# Patient Record
Sex: Male | Born: 1955 | Race: White | Hispanic: No | Marital: Married | Smoking: Former smoker
Health system: Southern US, Community
[De-identification: ages and names within clinical notes are randomized; demographics above are authoritative.]

## PROBLEM LIST (undated history)

## (undated) DIAGNOSIS — C61 Malignant neoplasm of prostate: Secondary | ICD-10-CM

## (undated) DIAGNOSIS — Z8042 Family history of malignant neoplasm of prostate: Secondary | ICD-10-CM

## (undated) DIAGNOSIS — I10 Essential (primary) hypertension: Secondary | ICD-10-CM

## (undated) DIAGNOSIS — S92909A Unspecified fracture of unspecified foot, initial encounter for closed fracture: Secondary | ICD-10-CM

## (undated) DIAGNOSIS — K219 Gastro-esophageal reflux disease without esophagitis: Secondary | ICD-10-CM

## (undated) HISTORY — DX: Malignant neoplasm of prostate: C61

## (undated) HISTORY — DX: Unspecified fracture of unspecified foot, initial encounter for closed fracture: S92.909A

## (undated) HISTORY — DX: Family history of malignant neoplasm of prostate: Z80.42

---

## 2013-01-14 DIAGNOSIS — S92909A Unspecified fracture of unspecified foot, initial encounter for closed fracture: Secondary | ICD-10-CM

## 2013-01-14 HISTORY — DX: Unspecified fracture of unspecified foot, initial encounter for closed fracture: S92.909A

## 2013-01-14 HISTORY — PX: FOOT FRACTURE SURGERY: SHX645

## 2013-07-13 DIAGNOSIS — R351 Nocturia: Secondary | ICD-10-CM | POA: Insufficient documentation

## 2013-07-13 DIAGNOSIS — N529 Male erectile dysfunction, unspecified: Secondary | ICD-10-CM | POA: Insufficient documentation

## 2014-12-01 ENCOUNTER — Other Ambulatory Visit: Payer: Self-pay | Admitting: Chiropractic Medicine

## 2014-12-01 DIAGNOSIS — M545 Low back pain: Secondary | ICD-10-CM

## 2014-12-13 ENCOUNTER — Other Ambulatory Visit: Payer: Self-pay

## 2015-01-16 ENCOUNTER — Emergency Department (HOSPITAL_COMMUNITY)
Admission: EM | Admit: 2015-01-16 | Discharge: 2015-01-16 | Disposition: A | Discharge status: Home / Self Care | Payer: BLUE CROSS/BLUE SHIELD | Attending: Emergency Medicine | Admitting: Emergency Medicine

## 2015-01-16 ENCOUNTER — Emergency Department (HOSPITAL_COMMUNITY): Payer: BLUE CROSS/BLUE SHIELD

## 2015-01-16 ENCOUNTER — Encounter (HOSPITAL_COMMUNITY): Payer: Self-pay | Admitting: Emergency Medicine

## 2015-01-16 DIAGNOSIS — I10 Essential (primary) hypertension: Secondary | ICD-10-CM | POA: Insufficient documentation

## 2015-01-16 DIAGNOSIS — Z79899 Other long term (current) drug therapy: Secondary | ICD-10-CM | POA: Diagnosis not present

## 2015-01-16 DIAGNOSIS — Z87891 Personal history of nicotine dependence: Secondary | ICD-10-CM | POA: Diagnosis not present

## 2015-01-16 DIAGNOSIS — Y9389 Activity, other specified: Secondary | ICD-10-CM | POA: Diagnosis not present

## 2015-01-16 DIAGNOSIS — S060X0A Concussion without loss of consciousness, initial encounter: Secondary | ICD-10-CM | POA: Insufficient documentation

## 2015-01-16 DIAGNOSIS — S0990XA Unspecified injury of head, initial encounter: Secondary | ICD-10-CM | POA: Diagnosis present

## 2015-01-16 DIAGNOSIS — W2113XA Struck by golf club, initial encounter: Secondary | ICD-10-CM | POA: Diagnosis not present

## 2015-01-16 DIAGNOSIS — Y998 Other external cause status: Secondary | ICD-10-CM | POA: Diagnosis not present

## 2015-01-16 DIAGNOSIS — Y9289 Other specified places as the place of occurrence of the external cause: Secondary | ICD-10-CM | POA: Diagnosis not present

## 2015-01-16 DIAGNOSIS — R42 Dizziness and giddiness: Secondary | ICD-10-CM

## 2015-01-16 HISTORY — DX: Essential (primary) hypertension: I10

## 2015-01-16 LAB — COMPREHENSIVE METABOLIC PANEL
ALBUMIN: 3.9 g/dL (ref 3.5–5.0)
ALT: 22 U/L (ref 17–63)
ANION GAP: 7 (ref 5–15)
AST: 27 U/L (ref 15–41)
Alkaline Phosphatase: 45 U/L (ref 38–126)
BUN: 23 mg/dL — ABNORMAL HIGH (ref 6–20)
CHLORIDE: 105 mmol/L (ref 101–111)
CO2: 28 mmol/L (ref 22–32)
Calcium: 9 mg/dL (ref 8.9–10.3)
Creatinine, Ser: 1.05 mg/dL (ref 0.61–1.24)
GFR calc non Af Amer: >60 mL/min (ref >60)
Glucose, Bld: 104 mg/dL — ABNORMAL HIGH (ref 65–99)
POTASSIUM: 4.5 mmol/L (ref 3.5–5.1)
SODIUM: 140 mmol/L (ref 135–145)
Total Bilirubin: 0.8 mg/dL (ref 0.3–1.2)
Total Protein: 6.7 g/dL (ref 6.5–8.1)

## 2015-01-16 LAB — CBC WITH DIFFERENTIAL/PLATELET
Basophils Absolute: 0 10*3/uL (ref 0.0–0.1)
Basophils Relative: 0 %
Eosinophils Absolute: 0.2 10*3/uL (ref 0.0–0.7)
Eosinophils Relative: 4 %
HEMATOCRIT: 42.8 % (ref 39.0–52.0)
HEMOGLOBIN: 14.4 g/dL (ref 13.0–17.0)
LYMPHS ABS: 1.2 10*3/uL (ref 0.7–4.0)
Lymphocytes Relative: 25 %
MCH: 31.6 pg (ref 26.0–34.0)
MCHC: 33.6 g/dL (ref 30.0–36.0)
MCV: 93.9 fL (ref 78.0–100.0)
MONO ABS: 0.4 10*3/uL (ref 0.1–1.0)
MONOS PCT: 8 %
NEUTROS ABS: 3 10*3/uL (ref 1.7–7.7)
NEUTROS PCT: 63 %
Platelets: 207 10*3/uL (ref 150–400)
RBC: 4.56 MIL/uL (ref 4.22–5.81)
RDW: 12.6 % (ref 11.5–15.5)
WBC: 4.8 10*3/uL (ref 4.0–10.5)

## 2015-01-16 LAB — I-STAT TROPONIN, ED: Troponin i, poc: 0.01 ng/mL (ref 0.00–0.08)

## 2015-01-16 MED ORDER — SODIUM CHLORIDE 0.9 % IV BOLUS (SEPSIS)
500.0000 mL | Freq: Once | INTRAVENOUS | Status: AC
  Administered 2015-01-16: 500 mL via INTRAVENOUS

## 2015-01-16 MED ORDER — MECLIZINE HCL 25 MG PO TABS: 25.0000 mg | ORAL_TABLET | Freq: Three times a day (TID) | ORAL | Status: DC | PRN

## 2015-01-16 MED ORDER — MECLIZINE HCL 25 MG PO TABS
25.0000 mg | ORAL_TABLET | Freq: Once | ORAL | Status: AC
  Administered 2015-01-16: 25 mg via ORAL
  Filled 2015-01-16: qty 1

## 2015-01-16 MED ORDER — LORAZEPAM 2 MG/ML IJ SOLN
1.0000 mg | Freq: Once | INTRAMUSCULAR | Status: AC
  Administered 2015-01-16: 1 mg via INTRAVENOUS
  Filled 2015-01-16: qty 1

## 2015-01-16 NOTE — ED Provider Notes (Signed)
CSN: [id number]     Arrival date & time 01/16/15  0804 History   First MD Initiated Contact with Patient 01/16/15 0825     Chief Complaint  Patient presents with  . Head Injury     (Consider location/radiation/quality/duration/timing/severity/associated sxs/prior Treatment) HPI Comments: 60 year old male with history of hypertension presents for dizziness. The patient reports that about 1 week ago he did hit himself in the forehead with a golf club and had a large goose egg on the right anterior forehead. He reports that the swelling and bruising has gone down significantly. He reports over the last few days he has had dizziness and feeling like he was drunk and off balance especially in the mornings. He says that yesterday he was still able to play golf and didn't feel completely off balance but had this underlying dizziness that has persisted. Denies headache. Denies ever having symptoms like this before. Dizziness gets worse with sitting up or laying back or with rolling from one side to another. No fevers or chills.   Past Medical History  Diagnosis Date  . Hypertension    History reviewed. No pertinent past surgical history. No family history on file. Social History  Substance Use Topics  . Smoking status: Former Research scientist (life sciences)  . Smokeless tobacco: None  . Alcohol Use: 1.2 oz/week    2 Cans of beer per week    Review of Systems  Constitutional: Negative for fever, chills, appetite change and fatigue.  HENT: Negative for congestion, postnasal drip, rhinorrhea and sinus pressure.   Eyes: Positive for visual disturbance (blurry/double vision). Negative for pain and redness.  Respiratory: Negative for cough, chest tightness and shortness of breath.   Cardiovascular: Negative for chest pain, palpitations and leg swelling.  Gastrointestinal: Negative for nausea, vomiting, abdominal pain and diarrhea.  Genitourinary: Negative for dysuria, urgency and frequency.  Musculoskeletal: Negative for  myalgias, back pain and gait problem.  Skin: Negative for rash.  Neurological: Positive for dizziness. Negative for syncope, facial asymmetry, weakness, light-headedness, numbness and headaches.  Hematological: Does not bruise/bleed easily.      Allergies  Lisinopril  Home Medications   Prior to Admission medications   Medication Sig Start Date End Date Taking? Authorizing Provider  esomeprazole (NEXIUM) 20 MG packet Take 20 mg by mouth daily before breakfast.   Yes Historical Provider, MD  amLODipine (NORVASC) 2.5 MG tablet Take 2.5 mg by mouth daily.    Historical Provider, MD  meclizine (ANTIVERT) 25 MG tablet Take 1 tablet (25 mg total) by mouth 3 (three) times daily as needed for dizziness. 01/16/15   Harvel Quale, MD   BP 158/103 mmHg  Pulse 56  Temp(Src) 98.2 F (36.8 C) (Oral)  Resp 16  SpO2 100% Physical Exam  Constitutional: He is oriented to person, place, and time. He appears well-developed and well-nourished. No distress.  HENT:  Head: Normocephalic and atraumatic.  Right Ear: External ear normal.  Left Ear: External ear normal.  Mouth/Throat: Oropharynx is clear and moist. No oropharyngeal exudate.  Eyes: Pupils are equal, round, and reactive to light. Right eye exhibits nystagmus (rightward). Left eye exhibits nystagmus (rightward).  Neck: Normal range of motion. Neck supple.  Cardiovascular: Normal rate, regular rhythm, normal heart sounds and intact distal pulses.   No murmur heard. Pulmonary/Chest: Effort normal. No respiratory distress. He has no wheezes. He has no rales.  Abdominal: Soft. He exhibits no distension. There is no tenderness.  Musculoskeletal: He exhibits no edema.  Neurological: He is alert and  oriented to person, place, and time. He has normal strength. No cranial nerve deficit or sensory deficit. He exhibits normal muscle tone. Coordination normal.  Normal finger to nose and heal to shin on examination.    Skin: Skin is warm and dry. No  rash noted. He is not diaphoretic.  Vitals reviewed.   ED Course  Procedures (including critical care time) Labs Review Labs Reviewed  COMPREHENSIVE METABOLIC PANEL - Abnormal; Notable for the following:    Glucose, Bld 104 (*)    BUN 23 (*)    All other components within normal limits  CBC WITH DIFFERENTIAL/PLATELET  I-STAT TROPOININ, ED    Imaging Review Ct Head Wo Contrast  01/16/2015  CLINICAL DATA:  Hip with golf club on 01/10/2015 with persistent right anterior Headache. EXAM: CT HEAD WITHOUT CONTRAST TECHNIQUE: Contiguous axial images were obtained from the base of the skull through the vertex without intravenous contrast. COMPARISON:  None. FINDINGS: There is no evidence for acute hemorrhage, hydrocephalus, mass lesion, or abnormal extra-axial fluid collection. No definite CT evidence for acute infarction. The visualized paranasal sinuses and mastoid air cells are clear. No evidence for skull fracture. IMPRESSION: Normal exam. Electronically Signed   By: Misty Stanley M.D.   On: 01/16/2015 09:16   Mr Brain Wo Contrast  01/16/2015  CLINICAL DATA:  60 year old hypertensive male with injury to right forehead last week. Nausea with dizziness worse with lying down and motion. Subsequent encounter. EXAM: MRI HEAD WITHOUT CONTRAST TECHNIQUE: Multiplanar, multiecho pulse sequences of the brain and surrounding structures were obtained without intravenous contrast. COMPARISON:  01/16/2015 head CT.  No comparison brain MR. FINDINGS: No acute infarct. No intracranial hemorrhage. Right mastoid region 1.9 cm sclerotic focus suggestive of osteoma otherwise no intracranial mass lesion noted on this unenhanced exam. Minimal mucosal thickening inferior right maxillary sinus and ethmoid sinus air cells. Minimal partial opacification left mastoid air cells. Minimal exophthalmos otherwise orbital structures unremarkable. Cervical medullary junction, pituitary region and pineal region within normal limits.  Major intracranial vascular structures are patent. IMPRESSION: No acute abnormality noted. 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 Electronically Signed   By: Genia Del M.D.   On: 01/16/2015 13:25   I have personally reviewed and evaluated these images and lab results as part of my medical decision-making.   EKG Interpretation   Date/Time:  Monday January 16 2015 08:30:11 EST Ventricular Rate:  63 PR Interval:  144 QRS Duration: 87 QT Interval:  426 QTC Calculation: 436 R Axis:   69 Text Interpretation:  Sinus rhythm No previous ECGs available Confirmed by  Dajanee Voorheis ([id number]) on 01/16/2015 8:33:04 AM      MDM  Patient was seen and evaluated in stable condition.  Normal neurologic examination but patient with feeling of being off balance and unsteady.  Labs, EKG, CT head normal.  Patient with persistent dizziness following Antivert and IV fluids.  MRI obtained that was negative for acute process.  Patient remained well appearing but with persistent mild vertigo.  Possible post concussive syndrome.  Discussed with patient all results and clinical impression.  Patient expressed understanding and agreement with plan for discharge with antivert and outpatient follow up.  Patient was discharged home in stable condition. Final diagnoses:  Concussion, without loss of consciousness, initial encounter  Vertigo    1. Post concussive syndrome  2. Vertigo    Harvel Quale, MD 01/17/15 [id number]

## 2015-01-16 NOTE — ED Notes (Signed)
Pt hit in right anterior forehead with gold club on Tuesday. Pt c/o nausea and dizziness worsened by laying down and movement, worse in mornings, slight alteration to proprioception, lower eyelid twitching. Negative Romberg.

## 2015-01-16 NOTE — ED Notes (Signed)
Pt stated dizziness at the time. EKG have been done

## 2015-01-16 NOTE — Discharge Instructions (Signed)
You were seen here today for your dizziness and head injury.  It appears that you have developed what is called vertigo from your head injury earlier.  Take the medication provided to help with symptoms.  Follow up with your primary care physician for reevaluation.  Concussion, Adult A concussion, or closed-head injury, is a brain injury caused by a direct blow to the head or by a quick and sudden movement (jolt) of the head or neck. Concussions are usually not life-threatening. Even so, the effects of a concussion can be serious. If you have had a concussion before, you are more likely to experience concussion-like symptoms after a direct blow to the head.  CAUSES  Direct blow to the head, such as from running into another player during a soccer game, being hit in a fight, or hitting your head on a hard surface.  A jolt of the head or neck that causes the brain to move back and forth inside the skull, such as in a car crash. SIGNS AND SYMPTOMS The signs of a concussion can be hard to notice. Early on, they may be missed by you, family members, and health care providers. You may look fine but act or feel differently. Symptoms are usually temporary, but they may last for days, weeks, or even longer. Some symptoms may appear right away while others may not show up for hours or days. Every head injury is different. Symptoms include:  Mild to moderate headaches that will not go away.  A feeling of pressure inside your head.  Having more trouble than usual:  Learning or remembering things you have heard.  Answering questions.  Paying attention or concentrating.  Organizing daily tasks.  Making decisions and solving problems.  Slowness in thinking, acting or reacting, speaking, or reading.  Getting lost or being easily confused.  Feeling tired all the time or lacking energy (fatigued).  Feeling drowsy.  Sleep disturbances.  Sleeping more than usual.  Sleeping less than  usual.  Trouble falling asleep.  Trouble sleeping (insomnia).  Loss of balance or feeling lightheaded or dizzy.  Nausea or vomiting.  Numbness or tingling.  Increased sensitivity to:  Sounds.  Lights.  Distractions.  Vision problems or eyes that tire easily.  Diminished sense of taste or smell.  Ringing in the ears.  Mood changes such as feeling sad or anxious.  Becoming easily irritated or angry for little or no reason.  Lack of motivation.  Seeing or hearing things other people do not see or hear (hallucinations). DIAGNOSIS Your health care provider can usually diagnose a concussion based on a description of your injury and symptoms. He or she will ask whether you passed out (lost consciousness) and whether you are having trouble remembering events that happened right before and during your injury. Your evaluation might include:  A brain scan to look for signs of injury to the brain. Even if the test shows no injury, you may still have a concussion.  Blood tests to be sure other problems are not present. TREATMENT  Concussions are usually treated in an emergency department, in urgent care, or at a clinic. You may need to stay in the hospital overnight for further treatment.  Tell your health care provider if you are taking any medicines, including prescription medicines, over-the-counter medicines, and natural remedies. Some medicines, such as blood thinners (anticoagulants) and aspirin, may increase the chance of complications. Also tell your health care provider whether you have had alcohol or are taking illegal drugs.  This information may affect treatment.  Your health care provider will send you home with important instructions to follow.  How fast you will recover from a concussion depends on many factors. These factors include how severe your concussion is, what part of your brain was injured, your age, and how healthy you were before the concussion.  Most  people with mild injuries recover fully. Recovery can take time. In general, recovery is slower in older persons. Also, persons who have had a concussion in the past or have other medical problems may find that it takes longer to recover from their current injury. HOME CARE INSTRUCTIONS General Instructions  Carefully follow the directions your health care provider gave you.  Only take over-the-counter or prescription medicines for pain, discomfort, or fever as directed by your health care provider.  Take only those medicines that your health care provider has approved.  Do not drink alcohol until your health care provider says you are well enough to do so. Alcohol and certain other drugs may slow your recovery and can put you at risk of further injury.  If it is harder than usual to remember things, write them down.  If you are easily distracted, try to do one thing at a time. For example, do not try to watch TV while fixing dinner.  Talk with family members or close friends when making important decisions.  Keep all follow-up appointments. Repeated evaluation of your symptoms is recommended for your recovery.  Watch your symptoms and tell others to do the same. Complications sometimes occur after a concussion. Older adults with a brain injury may have a higher risk of serious complications, such as a blood clot on the brain.  Tell your teachers, school nurse, school counselor, coach, athletic trainer, or work Freight forwarder about your injury, symptoms, and restrictions. Tell them about what you can or cannot do. They should watch for:  Increased problems with attention or concentration.  Increased difficulty remembering or learning new information.  Increased time needed to complete tasks or assignments.  Increased irritability or decreased ability to cope with stress.  Increased symptoms.  Rest. Rest helps the brain to heal. Make sure you:  Get plenty of sleep at night. Avoid staying  up late at night.  Keep the same bedtime hours on weekends and weekdays.  Rest during the day. Take daytime naps or rest breaks when you feel tired.  Limit activities that require a lot of thought or concentration. These include:  Doing homework or job-related work.  Watching TV.  Working on the computer.  Avoid any situation where there is potential for another head injury (football, hockey, soccer, basketball, martial arts, downhill snow sports and horseback riding). Your condition will get worse every time you experience a concussion. You should avoid these activities until you are evaluated by the appropriate follow-up health care providers. Returning To Your Regular Activities You will need to return to your normal activities slowly, not all at once. You must give your body and brain enough time for recovery.  Do not return to sports or other athletic activities until your health care provider tells you it is safe to do so.  Ask your health care provider when you can drive, ride a bicycle, or operate heavy machinery. Your ability to react may be slower after a brain injury. Never do these activities if you are dizzy.  Ask your health care provider about when you can return to work or school. Preventing Another Concussion It is very  important to avoid another brain injury, especially before you have recovered. In rare cases, another injury can lead to permanent brain damage, brain swelling, or death. The risk of this is greatest during the first 7-10 days after a head injury. Avoid injuries by:  Wearing a seat belt when riding in a car.  Drinking alcohol only in moderation.  Wearing a helmet when biking, skiing, skateboarding, skating, or doing similar activities.  Avoiding activities that could lead to a second concussion, such as contact or recreational sports, until your health care provider says it is okay.  Taking safety measures in your home.  Remove clutter and tripping  hazards from floors and stairways.  Use grab bars in bathrooms and handrails by stairs.  Place non-slip mats on floors and in bathtubs.  Improve lighting in dim areas. SEEK MEDICAL CARE IF:  You have increased problems paying attention or concentrating.  You have increased difficulty remembering or learning new information.  You need more time to complete tasks or assignments than before.  You have increased irritability or decreased ability to cope with stress.  You have more symptoms than before. Seek medical care if you have any of the following symptoms for more than 2 weeks after your injury:  Lasting (chronic) headaches.  Dizziness or balance problems.  Nausea.  Vision problems.  Increased sensitivity to noise or light.  Depression or mood swings.  Anxiety or irritability.  Memory problems.  Difficulty concentrating or paying attention.  Sleep problems.  Feeling tired all the time. SEEK IMMEDIATE MEDICAL CARE IF:  You have severe or worsening headaches. These may be a sign of a blood clot in the brain.  You have weakness (even if only in one hand, leg, or part of the face).  You have numbness.  You have decreased coordination.  You vomit repeatedly.  You have increased sleepiness.  One pupil is larger than the other.  You have convulsions.  You have slurred speech.  You have increased confusion. This may be a sign of a blood clot in the brain.  You have increased restlessness, agitation, or irritability.  You are unable to recognize people or places.  You have neck pain.  It is difficult to wake you up.  You have unusual behavior changes.  You lose consciousness. MAKE SURE YOU:  Understand these instructions.  Will watch your condition.  Will get help right away if you are not doing well or get worse.   This information is not intended to replace advice given to you by your health care provider. Make sure you discuss any  questions you have with your health care provider.   Document Released: 03/23/2003 Document Revised: 01/21/2014 Document Reviewed: 07/23/2012 Elsevier Interactive Patient Education 2016 [employer].  Vertigo Vertigo means you feel like you or your surroundings are moving when they are not. Vertigo can be dangerous if it occurs when you are at work, driving, or performing difficult activities.  CAUSES  Vertigo occurs when there is a conflict of signals sent to your brain from the visual and sensory systems in your body. There are many different causes of vertigo, including:  Infections, especially in the inner ear.  A bad reaction to a drug or misuse of alcohol and medicines.  Withdrawal from drugs or alcohol.  Rapidly changing positions, such as lying down or rolling over in bed.  A migraine headache.  Decreased blood flow to the brain.  Increased pressure in the brain from a head injury, infection, tumor,  or bleeding. SYMPTOMS  You may feel as though the world is spinning around or you are falling to the ground. Because your balance is upset, vertigo can cause nausea and vomiting. You may have involuntary eye movements (nystagmus). DIAGNOSIS  Vertigo is usually diagnosed by physical exam. If the cause of your vertigo is unknown, your caregiver may perform imaging tests, such as an MRI scan (magnetic resonance imaging). TREATMENT  Most cases of vertigo resolve on their own, without treatment. Depending on the cause, your caregiver may prescribe certain medicines. If your vertigo is related to body position issues, your caregiver may recommend movements or procedures to correct the problem. In rare cases, if your vertigo is caused by certain inner ear problems, you may need surgery. HOME CARE INSTRUCTIONS   Follow your caregiver's instructions.  Avoid driving.  Avoid operating heavy machinery.  Avoid performing any tasks that would be dangerous to you or others during a vertigo  episode.  Tell your caregiver if you notice that certain medicines seem to be causing your vertigo. Some of the medicines used to treat vertigo episodes can actually make them worse in some people. SEEK IMMEDIATE MEDICAL CARE IF:   Your medicines do not relieve your vertigo or are making it worse.  You develop problems with talking, walking, weakness, or using your arms, hands, or legs.  You develop severe headaches.  Your nausea or vomiting continues or gets worse.  You develop visual changes.  A family member notices behavioral changes.  Your condition gets worse. MAKE SURE YOU:  Understand these instructions.  Will watch your condition.  Will get help right away if you are not doing well or get worse.   This information is not intended to replace advice given to you by your health care provider. Make sure you discuss any questions you have with your health care provider.   Document Released: 10/10/2004 Document Revised: 03/25/2011 Document Reviewed: 04/25/2014 Elsevier Interactive Patient Education [employer].

## 2015-02-13 ENCOUNTER — Ambulatory Visit
Admission: RE | Admit: 2015-02-13 | Discharge: 2015-02-13 | Disposition: A | Discharge status: Home / Self Care | Payer: BLUE CROSS/BLUE SHIELD | Source: Ambulatory Visit | Attending: Family Medicine | Admitting: Family Medicine

## 2015-02-13 ENCOUNTER — Telehealth: Payer: Self-pay | Admitting: Diagnostic Neuroimaging

## 2015-02-13 ENCOUNTER — Other Ambulatory Visit: Payer: Self-pay | Admitting: Family Medicine

## 2015-02-13 DIAGNOSIS — R519 Headache, unspecified: Secondary | ICD-10-CM

## 2015-02-13 DIAGNOSIS — R51 Headache: Secondary | ICD-10-CM

## 2015-02-13 DIAGNOSIS — S0990XA Unspecified injury of head, initial encounter: Secondary | ICD-10-CM

## 2015-02-13 NOTE — Telephone Encounter (Signed)
New patient appointment scheduled per Dr Leta Baptist.Marland Kitchen

## 2015-02-13 NOTE — Telephone Encounter (Signed)
Received phone call from Dr. Radene Ou about new consult for outpatient Randy Barrera. Patient had onset of traumatic headache in late December 2016 with normal CT and MRI on 01/16/15. Patient continues to have headache with worsening and new type of headache around 02/03/15.  PLAN: - advised urgent non-contrast CT today - will see patient in consult tomorrow at 3:45pm   Penni Bombard, MD [id number], [id number] PM Certified in Neurology, Neurophysiology and Neuroimaging  [facility] [address] [place], [place] [address] [phone]

## 2015-02-14 ENCOUNTER — Ambulatory Visit (INDEPENDENT_AMBULATORY_CARE_PROVIDER_SITE_OTHER): Payer: BLUE CROSS/BLUE SHIELD | Admitting: Diagnostic Neuroimaging

## 2015-02-14 ENCOUNTER — Encounter: Payer: Self-pay | Admitting: Diagnostic Neuroimaging

## 2015-02-14 VITALS — BP 154/96 | HR 51 | Ht 71.5 in | Wt 193.2 lb

## 2015-02-14 DIAGNOSIS — G44309 Post-traumatic headache, unspecified, not intractable: Secondary | ICD-10-CM | POA: Diagnosis not present

## 2015-02-14 MED ORDER — AMITRIPTYLINE HCL 25 MG PO TABS: 25.0000 mg | ORAL_TABLET | Freq: Every day | ORAL | Status: DC

## 2015-02-14 NOTE — Progress Notes (Signed)
[facility]  PATIENT: Randy Barrera DOB: Nov 02, 1955  REFERRING CLINICIAN: V Rankins HISTORY FROM: patient  REASON FOR VISIT: new consult    HISTORICAL  CHIEF COMPLAINT:  Chief Complaint  Patient presents with  . Headache    rm 7, New pt, HA following 01/10/15 concussion," awoke from sleep on 02/09/15 with HA that felt like explosion in my head and hasn't gone away", Tyl #3 dulls it"     HISTORY OF PRESENT ILLNESS:   60 year old right-handed male here for evaluation of headaches.  01/10/15 patient was playing golf, trying to remove a sand wedge from the bag, when it was suddenly dislodged and came up and struck him in the right for head. He had severe pain and a "knot" developed over the right for head. He did not lose consciousness. No dizziness or nausea. She was able to complete playing golf that day.  Over the next 4 days patient did fine with no symptoms. Around 01/15/15 patient had onset of balance difficulty, vertigo and nausea. The next day he had CT and MRI brain scan performed which were unremarkable. Patient continues to have intermittent mild nausea and dizziness spells. These gradually subsided.  02/09/15 patient woke up in the night with a severe worst headache of life sensation with severe pressure and soreness on the top of his head. Symptoms were worse with lying down and relieved if he sat up. Patient went back to PCP and then had his another CT scan of the head on 02/13/15 which was unremarkable.  Patient continues to have headache sensation. He is having trouble sleeping. Patient has some baseline insomnia and has been on Benadryl at nighttime for several years. He is also taking Tylenol with codeine for headaches.  No prior history of migraine or headaches. No family history of migraine. No other significant traumas or concussions in the past.   REVIEW OF SYSTEMS: Full 14 system review of systems performed and notable only for nausea dizziness  headache aching muscles hearing loss ringing in ears spinning sensation.   ALLERGIES: Allergies  Allergen Reactions  . Lisinopril Hives, Swelling and Rash    HOME MEDICATIONS: Outpatient Prescriptions Prior to Visit  Medication Sig Dispense Refill  . amLODipine (NORVASC) 2.5 MG tablet Take 2.5 mg by mouth daily.    Marland Kitchen esomeprazole (NEXIUM) 20 MG packet Take 20 mg by mouth daily before breakfast.    . meclizine (ANTIVERT) 25 MG tablet Take 1 tablet (25 mg total) by mouth 3 (three) times daily as needed for dizziness. 20 tablet 0   No facility-administered medications prior to visit.    PAST MEDICAL HISTORY: Past Medical History  Diagnosis Date  . Hypertension   . Foot fracture 2015    PAST SURGICAL HISTORY: Past Surgical History  Procedure Laterality Date  . Foot fracture surgery Right 2015    FAMILY HISTORY: Family History  Problem Relation Age of Onset  . Cancer Father     SOCIAL HISTORY:  Social History   Social History  . Marital Status: Married    Spouse Name: Pamala Hurry  . Number of Children: 1  . Years of Education: 16   Occupational History  .      [employer]   Social History Main Topics  . Smoking status: Former Smoker    Quit date: 10/05/1994  . Smokeless tobacco: Not on file  . Alcohol Use: 1.2 oz/week    2 Cans of beer per week     Comment: 2 beers a day  .  Drug Use: No  . Sexual Activity: Not on file   Other Topics Concern  . Not on file   Social History Narrative   Lives at home with wife    Caffeine use- coffee- 2 daily     PHYSICAL EXAM  GENERAL EXAM/CONSTITUTIONAL: Vitals:  Filed Vitals:   02/14/15 1549  BP: 154/96  Pulse: 51  Height: 5' 11.5" (1.816 m)  Weight: 193 lb 3.2 oz (87.635 kg)     Body mass index is 26.57 kg/(m^2).  Visual Acuity Screening   Right eye Left eye Both eyes  Without correction: 20/30 20/30   With correction:        Patient is in no distress; well developed, nourished and groomed; neck is  supple  CARDIOVASCULAR:  Examination of carotid arteries is normal; no carotid bruits  Regular rate and rhythm, no murmurs  Examination of peripheral vascular system by observation and palpation is normal  EYES:  Ophthalmoscopic exam of optic discs and posterior segments is normal; no papilledema or hemorrhages  MUSCULOSKELETAL:  Gait, strength, tone, movements noted in Neurologic exam below  NEUROLOGIC: MENTAL STATUS:  No flowsheet data found.  awake, alert, oriented to person, place and time  recent and remote memory intact  normal attention and concentration  language fluent, comprehension intact, naming intact,   fund of knowledge appropriate  CRANIAL NERVE:   2nd - no papilledema on fundoscopic exam  2nd, 3rd, 4th, 6th - pupils equal and reactive to light, visual fields full to confrontation, extraocular muscles intact, no nystagmus  5th - facial sensation symmetric  7th - facial strength symmetric  8th - hearing intact  9th - palate elevates symmetrically, uvula midline  11th - shoulder shrug symmetric  12th - tongue protrusion midline  MOTOR:   normal bulk and tone, full strength in the BUE, BLE  SENSORY:   normal and symmetric to light touch, temperature, vibration   COORDINATION:   finger-nose-finger, fine finger movements normal  REFLEXES:   deep tendon reflexes present and symmetric  GAIT/STATION:   narrow based gait; able to walk tandem; romberg is negative    DIAGNOSTIC DATA (LABS, IMAGING, TESTING) - I reviewed patient records, labs, notes, testing and imaging myself where available.  Lab Results  Component Value Date   WBC 4.8 01/16/2015   HGB 14.4 01/16/2015   HCT 42.8 01/16/2015   MCV 93.9 01/16/2015   PLT 207 01/16/2015      Component Value Date/Time   NA 140 01/16/2015 0904   K 4.5 01/16/2015 0904   CL 105 01/16/2015 0904   CO2 28 01/16/2015 0904   GLUCOSE 104* 01/16/2015 0904   BUN 23* 01/16/2015 0904    CREATININE 1.05 01/16/2015 0904   CALCIUM 9.0 01/16/2015 0904   PROT 6.7 01/16/2015 0904   ALBUMIN 3.9 01/16/2015 0904   AST 27 01/16/2015 0904   ALT 22 01/16/2015 0904   ALKPHOS 45 01/16/2015 0904   BILITOT 0.8 01/16/2015 0904   GFRNONAA >60 01/16/2015 0904   GFRAA >60 01/16/2015 0904   No results found for: CHOL, HDL, LDLCALC, LDLDIRECT, TRIG, CHOLHDL No results found for: HGBA1C No results found for: VITAMINB12 No results found for: TSH    01/16/15 CT head [I reviewed images myself and agree with interpretation. -VRP]  - normal  01/16/15 MRI brain [I reviewed images myself and agree with interpretation. -VRP]  - No acute abnormality noted. 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  02/13/15 CT head [I reviewed  images myself and agree with interpretation. -VRP]  - No acute, subacute or significant abnormality.      ASSESSMENT AND PLAN  60 y.o. year old male here with right frontal head trauma being struck by golf club on 01/10/15, now with likely postconcussion headache. Serial neuroimaging studies have been unremarkable. Neurologic examination is normal.    Dx:  Post-concussion headache    PLAN: - Advised patient to focus on adequate hydration, nutrition, exercise and sleep.  - Advised that he may need to slightly reduce the frequency and intensity of his activities.  - We'll give patient a trial of amitriptyline at nighttime to see if this helps reduce headache and improve quality of sleep.  Meds ordered this encounter  Medications  . DISCONTD: amitriptyline (ELAVIL) 25 MG tablet    Sig: Take 1 tablet (25 mg total) by mouth at bedtime.    Dispense:  30 tablet    Refill:  3  . amitriptyline (ELAVIL) 25 MG tablet    Sig: Take 1 tablet (25 mg total) by mouth at bedtime.    Dispense:  30 tablet    Refill:  3   Return in about 1 month (around 03/14/2015).    Penni Bombard, MD [id number], [id number] PM Certified in Neurology,  Neurophysiology and Neuroimaging  [facility] [address], [place],  [address] [phone]

## 2015-02-14 NOTE — Patient Instructions (Signed)
Thank you for coming to see [place] at [facility]. I hope we have been able to provide you high quality care today.  You may receive a patient satisfaction survey over the next few weeks. We would appreciate your feedback and comments so that we may continue to improve ourselves and the health of our patients.  - try amitriptyline 52m at bedtime - use tylenol with codeine as needed for headache - moderate your activities and work  ~~~~~~~~~~~~~~~~~~~~~~~~~~~~~~~~~~~~~~~~~~~~~~~~~~~~~~~~~~~~~~~~~  DR. Jasn Xia'S GUIDE TO HAPPY AND HEALTHY LIVING These are some of my general health and wellness recommendations. Some of them may apply to you better than others. Please use common sense as you try these suggestions and feel free to ask me any questions.   ACTIVITY/FITNESS Mental, social, emotional and physical stimulation are very important for brain and body health. Try learning a new activity (arts, music, language, sports, games).  Keep moving your body to the best of your abilities. You can do this at home, inside or outside, the park, community center, gym or anywhere you like. Consider a physical therapist or personal trainer to get started. Consider the app Sworkit. Fitness trackers such as smart-watches, smart-phones or Fitbits can help as well.   NUTRITION Eat more plants: colorful vegetables, nuts, seeds and berries.  Eat less sugar, salt, preservatives and processed foods.  Avoid toxins such as cigarettes and alcohol.  Drink water when you are thirsty. Warm water with a slice of lemon is an excellent morning drink to start the day.  Consider these websites for more information The Nutrition Source (hhttps://www.henry-hernandez.biz/ [employer] Nutrition (wWindowBlog.ch   RELAXATION Consider practicing mindfulness meditation or other relaxation techniques such as deep breathing, prayer, yoga, tai chi, massage. See  website mindful.org or the apps Headspace or Calm to help get started.   SLEEP Try to get at least 7-8+ hours sleep per day. Regular exercise and reduced caffeine will help you sleep better. Practice good sleep hygeine techniques. See website sleep.org for more information.   PLANNING Prepare estate planning, living will, healthcare POA documents. Sometimes this is best planned with the help of an attorney. Theconversationproject.org and agingwithdignity.org are excellent resources.

## 2015-03-23 ENCOUNTER — Ambulatory Visit (INDEPENDENT_AMBULATORY_CARE_PROVIDER_SITE_OTHER): Payer: BLUE CROSS/BLUE SHIELD | Admitting: Diagnostic Neuroimaging

## 2015-03-23 ENCOUNTER — Encounter: Payer: Self-pay | Admitting: Diagnostic Neuroimaging

## 2015-03-23 VITALS — BP 159/95 | HR 65 | Ht 71.5 in | Wt 189.6 lb

## 2015-03-23 DIAGNOSIS — G44309 Post-traumatic headache, unspecified, not intractable: Secondary | ICD-10-CM | POA: Diagnosis not present

## 2015-03-23 DIAGNOSIS — G4700 Insomnia, unspecified: Secondary | ICD-10-CM | POA: Diagnosis not present

## 2015-03-23 MED ORDER — AMITRIPTYLINE HCL 25 MG PO TABS: 25.0000 mg | ORAL_TABLET | Freq: Every day | ORAL | Status: DC

## 2015-03-23 NOTE — Patient Instructions (Signed)
-   may try tapering off amitriptyline (1 tab every other day for 1 week, then stop) to see if side effects improve

## 2015-03-23 NOTE — Progress Notes (Signed)
[facility]  PATIENT: Randy Barrera DOB: 1955-03-18  REFERRING CLINICIAN: V Rankins HISTORY FROM: patient  REASON FOR VISIT: follow up   HISTORICAL  CHIEF COMPLAINT:  Chief Complaint  Patient presents with  . Post concussion HA    rm 6, "dramatically better, occass HA relieved w/Advil, occass vertigo"  . Follow-up    6 week    HISTORY OF PRESENT ILLNESS:   UPDATE 03/23/15: Since last visit, doing much better. HA improved. Some residual dizziness. Has noted loss of libido, loss of interest in drinking beer. Not sure if it is concussion related or medication (amitriptyline related). R eyelid still twitches. Sleep is much improved.  PRIOR HPI (02/14/15): 60 year old right-handed male here for evaluation of headaches. 01/10/15 patient was playing golf, trying to remove a sand wedge from the bag, when it was suddenly dislodged and came up and struck him in the right for head. He had severe pain and a "knot" developed over the right for head. He did not lose consciousness. No dizziness or nausea. She was able to complete playing golf that day. Over the next 4 days patient did fine with no symptoms. Around 01/15/15 patient had onset of balance difficulty, vertigo and nausea. The next day he had CT and MRI brain scan performed which were unremarkable. Patient continues to have intermittent mild nausea and dizziness spells. These gradually subsided. 02/09/15 patient woke up in the night with a severe worst headache of life sensation with severe pressure and soreness on the top of his head. Symptoms were worse with lying down and relieved if he sat up. Patient went back to PCP and then had his another CT scan of the head on 02/13/15 which was unremarkable. Patient continues to have headache sensation. He is having trouble sleeping. Patient has some baseline insomnia and has been on Benadryl at nighttime for several years. He is also taking Tylenol with codeine for headaches. No prior  history of migraine or headaches. No family history of migraine. No other significant traumas or concussions in the past.   REVIEW OF SYSTEMS: Full 14 system review of systems performed and negative.    ALLERGIES: Allergies  Allergen Reactions  . Lisinopril Hives, Swelling and Rash    HOME MEDICATIONS: Outpatient Prescriptions Prior to Visit  Medication Sig Dispense Refill  . Acetaminophen-Codeine (TYLENOL WITH CODEINE #3 PO) Take by mouth. 02/14/15 one tab every 6 hrs    . amLODipine (NORVASC) 2.5 MG tablet Take 2.5 mg by mouth daily.    . diphenhydrAMINE (SOMINEX) 25 MG tablet Take 25 mg by mouth at bedtime as needed for sleep.    Marland Kitchen esomeprazole (NEXIUM) 20 MG packet Take 20 mg by mouth daily before breakfast.    . amitriptyline (ELAVIL) 25 MG tablet Take 1 tablet (25 mg total) by mouth at bedtime. 30 tablet 3   No facility-administered medications prior to visit.    PAST MEDICAL HISTORY: Past Medical History  Diagnosis Date  . Hypertension   . Foot fracture 2015    PAST SURGICAL HISTORY: Past Surgical History  Procedure Laterality Date  . Foot fracture surgery Right 2015    FAMILY HISTORY: Family History  Problem Relation Age of Onset  . Cancer Father     SOCIAL HISTORY:  Social History   Social History  . Marital Status: Married    Spouse Name: Pamala Hurry  . Number of Children: 1  . Years of Education: 16   Occupational History  .  [employer]   Social History Main Topics  . Smoking status: Former Smoker    Quit date: 10/05/1994  . Smokeless tobacco: Not on file  . Alcohol Use: 1.2 oz/week    2 Cans of beer per week     Comment: 2 beers a day  . Drug Use: No  . Sexual Activity: Not on file   Other Topics Concern  . Not on file   Social History Narrative   Lives at home with wife    Caffeine use- coffee- 2 daily     PHYSICAL EXAM  GENERAL EXAM/CONSTITUTIONAL: Vitals:  Filed Vitals:   03/23/15 1312  BP: 159/95  Pulse: 65  Height: 5'  11.5" (1.816 m)  Weight: 189 lb 9.6 oz (86.002 kg)   Body mass index is 26.08 kg/(m^2). No exam data present  Patient is in no distress; well developed, nourished and groomed; neck is supple  CARDIOVASCULAR:  Examination of carotid arteries is normal; no carotid bruits  Regular rate and rhythm, no murmurs  Examination of peripheral vascular system by observation and palpation is normal  EYES:  Ophthalmoscopic exam of optic discs and posterior segments is normal; no papilledema or hemorrhages  MUSCULOSKELETAL:  Gait, strength, tone, movements noted in Neurologic exam below  NEUROLOGIC: MENTAL STATUS:  No flowsheet data found.  awake, alert, oriented to person, place and time  recent and remote memory intact  normal attention and concentration  language fluent, comprehension intact, naming intact,   fund of knowledge appropriate  CRANIAL NERVE:   2nd - no papilledema on fundoscopic exam  2nd, 3rd, 4th, 6th - pupils equal and reactive to light, visual fields full to confrontation, extraocular muscles intact, no nystagmus  5th - facial sensation symmetric  7th - facial strength symmetric  8th - hearing intact  9th - palate elevates symmetrically, uvula midline  11th - shoulder shrug symmetric  12th - tongue protrusion midline  MOTOR:   normal bulk and tone, full strength in the BUE, BLE  SENSORY:   normal and symmetric to light touch, temperature, vibration   COORDINATION:   finger-nose-finger, fine finger movements normal  REFLEXES:   deep tendon reflexes present and symmetric  GAIT/STATION:   narrow based gait; able to walk tandem; romberg is negative    DIAGNOSTIC DATA (LABS, IMAGING, TESTING) - I reviewed patient records, labs, notes, testing and imaging myself where available.  Lab Results  Component Value Date   WBC 4.8 01/16/2015   HGB 14.4 01/16/2015   HCT 42.8 01/16/2015   MCV 93.9 01/16/2015   PLT 207 01/16/2015        Component Value Date/Time   NA 140 01/16/2015 0904   K 4.5 01/16/2015 0904   CL 105 01/16/2015 0904   CO2 28 01/16/2015 0904   GLUCOSE 104* 01/16/2015 0904   BUN 23* 01/16/2015 0904   CREATININE 1.05 01/16/2015 0904   CALCIUM 9.0 01/16/2015 0904   PROT 6.7 01/16/2015 0904   ALBUMIN 3.9 01/16/2015 0904   AST 27 01/16/2015 0904   ALT 22 01/16/2015 0904   ALKPHOS 45 01/16/2015 0904   BILITOT 0.8 01/16/2015 0904   GFRNONAA >60 01/16/2015 0904   GFRAA >60 01/16/2015 0904   No results found for: CHOL, HDL, LDLCALC, LDLDIRECT, TRIG, CHOLHDL No results found for: HGBA1C No results found for: VITAMINB12 No results found for: TSH    01/16/15 CT head [I reviewed images myself and agree with interpretation. -VRP]  - normal  01/16/15 MRI brain [I reviewed images  myself and agree with interpretation. -VRP]  - No acute abnormality noted. 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  02/13/15 CT head [I reviewed images myself and agree with interpretation. -VRP]  - No acute, subacute or significant abnormality.      ASSESSMENT AND PLAN  60 y.o. year old male here with right frontal head trauma being struck by golf club on 01/10/15, now with likely postconcussion headache. Serial neuroimaging studies have been unremarkable. Neurologic examination is normal. Patient improving with time and medications (amitriptyline) but some side effects noted (decr libido, anhedonia).    Dx:  Post-concussion headache  Insomnia    PLAN: I spent 15 minutes of face to face time with patient. Greater than 50% of time was spent in counseling and coordination of care with patient. In summary we discussed:  - Advised patient to focus on adequate hydration, nutrition, exercise and sleep.  - Advised that he may need to slightly reduce the frequency and intensity of his activities.  - may try tapering off amitriptyline (1 tab every other day for 1 week, then stop) to see if side  effects improve  Meds ordered this encounter  Medications  . amitriptyline (ELAVIL) 25 MG tablet    Sig: Take 1 tablet (25 mg total) by mouth at bedtime.    Dispense:  30 tablet    Refill:  3   Return in about 3 months (around 06/23/2015).    Penni Bombard, MD [id number], [id number] PM Certified in Neurology, Neurophysiology and Neuroimaging  [facility] [address], [place], [place] [address] [phone]

## 2015-09-07 ENCOUNTER — Other Ambulatory Visit: Payer: Self-pay | Admitting: Diagnostic Neuroimaging

## 2015-09-08 NOTE — Telephone Encounter (Signed)
Spoke to patient re: refill on amitriptyline. He stated to disregard, it was sent in error. He stated he is no longer taking it, and he feels better. Although he stated he did not feel he was having side effects form the medication.   Reminded him that Dr Leta Baptist wanted to see him for a 3 month follow up; he was last seen 03/2015.   Scheduled his FU for 09/19/15; advised he arrive 15 min early. He verbalized understanding, appreciation for call.

## 2015-09-19 ENCOUNTER — Ambulatory Visit: Payer: Self-pay | Admitting: Diagnostic Neuroimaging

## 2015-11-28 SURGERY — Surgical Case
Anesthesia: *Unknown

## 2017-02-05 DIAGNOSIS — Z005 Encounter for examination of potential donor of organ and tissue: Secondary | ICD-10-CM | POA: Insufficient documentation

## 2017-02-06 DIAGNOSIS — E538 Deficiency of other specified B group vitamins: Secondary | ICD-10-CM | POA: Insufficient documentation

## 2017-03-18 DIAGNOSIS — Z905 Acquired absence of kidney: Secondary | ICD-10-CM | POA: Insufficient documentation

## 2017-03-28 HISTORY — PX: KIDNEY DONATION: SHX685

## 2017-07-24 DIAGNOSIS — Z524 Kidney donor: Secondary | ICD-10-CM | POA: Insufficient documentation

## 2018-01-06 ENCOUNTER — Encounter: Payer: Self-pay | Admitting: Genetic Counselor

## 2018-01-06 ENCOUNTER — Telehealth: Payer: Self-pay | Admitting: Genetic Counselor

## 2018-01-06 NOTE — Telephone Encounter (Signed)
Received a genetic counseling referral from Dr. Jeffie Pollock for a strong family history of prostate cancer. A genetic counseling appt has been scheduled for the pt to see Roma Kayser on 1/22 at 1pm. Letter mailed to the pt.

## 2018-01-15 ENCOUNTER — Encounter: Payer: Self-pay | Admitting: Genetic Counselor

## 2018-01-15 NOTE — Progress Notes (Signed)
Patient called and left voicemail inquiring about genetic testing approval.  Advised patient I could forward his information to Santiago Glad w/ genetics and requesting a copy of his new insurance card. He states he has Karen's number and he emailed me a copy of his card.  Forwarded information to [facility] at [facility] and here.  He has my contact name and number for any additional financial questions or concerns.

## 2018-01-21 ENCOUNTER — Telehealth: Payer: Self-pay | Admitting: Genetic Counselor

## 2018-01-21 NOTE — Telephone Encounter (Signed)
Patient Randy Barrera asking whether his new insurance [facility]) will cover genetic counseling and testing.  I Randy Barrera on his VM asking that he CB.

## 2018-02-04 ENCOUNTER — Inpatient Hospital Stay: Payer: 59 | Attending: Genetic Counselor | Admitting: Genetic Counselor

## 2018-02-04 ENCOUNTER — Encounter: Payer: Self-pay | Admitting: Genetic Counselor

## 2018-02-04 ENCOUNTER — Inpatient Hospital Stay: Payer: 59

## 2018-02-04 DIAGNOSIS — C61 Malignant neoplasm of prostate: Secondary | ICD-10-CM | POA: Diagnosis not present

## 2018-02-04 DIAGNOSIS — Z8042 Family history of malignant neoplasm of prostate: Secondary | ICD-10-CM

## 2018-02-04 NOTE — Progress Notes (Signed)
REFERRING PROVIDER: Irine Seal, MD [address] [place], [place] [address]  PRIMARY PROVIDER:  Lawerance Cruel, MD  PRIMARY REASON FOR VISIT:  1. Family history of prostate cancer   2. Prostate cancer ([place])      HISTORY OF PRESENT ILLNESS:   Randy Barrera, a 63 y.o. male, was seen for a [place] cancer genetics consultation at the request of Dr. Jeffie Pollock due to a personal and family history of cancer.  Randy Barrera presents to clinic today to discuss the possibility of a hereditary predisposition to cancer, genetic testing, and to further clarify his future cancer risks, as well as potential cancer risks for family members.   In November 2019, at the age of 73, Randy Barrera was diagnosed with prostate cancer.  Gleason score was 3+3=6.       CANCER HISTORY:   No history exists.     Past Medical History:  Diagnosis Date  . Family history of prostate cancer   . Foot fracture 2015  . Hypertension   . Prostate cancer [facility])     Past Surgical History:  Procedure Laterality Date  . FOOT FRACTURE SURGERY Right 2015    Social History   Socioeconomic History  . Marital status: Married    Spouse name: Pamala Hurry  . Number of children: 1  . Years of education: 72  . Highest education level: Not on file  Occupational History    Comment: [employer]  . Financial resource strain: Not on file  . Food insecurity:    Worry: Not on file    Inability: Not on file  . Transportation needs:    Medical: Not on file    Non-medical: Not on file  Tobacco Use  . Smoking status: Former Smoker    Last attempt to quit: 10/05/1994    Years since quitting: 23.3  Substance and Sexual Activity  . Alcohol use: Yes    Alcohol/week: 2.0 standard drinks    Types: 2 Cans of beer per week    Comment: 2 beers a day  . Drug use: No  . Sexual activity: Not on file  Lifestyle  . Physical activity:    Days per week: Not on file    Minutes per session: Not on file  . Stress: Not on file   Relationships  . Social connections:    Talks on phone: Not on file    Gets together: Not on file    Attends religious service: Not on file    Active member of club or organization: Not on file    Attends meetings of clubs or organizations: Not on file    Relationship status: Not on file  Other Topics Concern  . Not on file  Social History Narrative   Lives at home with wife    Caffeine use- coffee- 2 daily     FAMILY HISTORY:  We obtained a detailed, 4-generation family history.  Significant diagnoses are listed below: Family History  Problem Relation Age of Onset  . Prostate cancer Father   . Prostate cancer Brother     The patient has one daughter who is cancer free.  He has three brothers, one who was daignosed with prostate cancer at age 67. Both parents are deceased.  The patient's mother had COPD.  She had a brother and sister who are reportedly cancer free.  Her parents are deceased, the grandmother had some form of cancer.  The grandfather had liver cirrhosis.  The patient's father  was diagnosed with prostate cancer around age 54.  He had one sister who was cancer free.  There is no other reported family history of cancer.  Randy Barrera is unaware of previous family history of genetic testing for hereditary cancer risks. Patient's maternal ancestors are of Caucasian descent. There is no reported Ashkenazi Jewish ancestry. There is no known consanguinity.  GENETIC COUNSELING ASSESSMENT: Randy Barrera is a 63 y.o. male with a personal and family history of prostate cancer which is somewhat suggestive of a hereditary cancer syndrome and predisposition to cancer. We, therefore, discussed and recommended the following at today's visit.   DISCUSSION: We discussed that about 15% of prostate cancer is hereditary.  There are several genes that can increase the risk for prostate cancer, most have other cancers associated with it such as breast, ovarian, pancreatic and colon cancer.   One gene, HOXB13 is solely a prostate cancer gene, that does not seem to have other cancers associated with it.  Based on the lack of family history of other cancers, the likelihood for his prostate cancer to be associated with a gene such as BRCA is low, although not zero.    We reviewed the characteristics, features and inheritance patterns of hereditary cancer syndromes. We also discussed genetic testing, including the appropriate family members to test, the process of testing, insurance coverage and turn-around-time for results. We discussed the implications of a negative, positive and/or variant of uncertain significant result. We recommended Randy Barrera pursue genetic testing for the common hereditary cancer gene panel. The Hereditary Gene Panel offered by [facility] includes sequencing and/or deletion duplication testing of the following 47 genes: APC, ATM, AXIN2, BARD1, BMPR1A, BRCA1, BRCA2, BRIP1, CDH1, CDK4, CDKN2A (p14ARF), CDKN2A (p16INK4a), CHEK2, CTNNA1, DICER1, EPCAM (Deletion/duplication testing only), GREM1 (promoter region deletion/duplication testing only), KIT, MEN1, MLH1, MSH2, MSH3, MSH6, MUTYH, NBN, NF1, NHTL1, PALB2, PDGFRA, PMS2, POLD1, POLE, PTEN, RAD50, RAD51C, RAD51D, SDHB, SDHC, SDHD, SMAD4, SMARCA4. STK11, TP53, TSC1, TSC2, and VHL.  The following genes were evaluated for sequence changes only: SDHA and HOXB13 c.251G>A variant only.   Based on Randy Barrera personal and family history of cancer, he meets medical criteria for genetic testing. Despite that he meets criteria, he may still have an out of pocket cost. We discussed that if his out of pocket cost for testing is over $100, the laboratory will call and confirm whether he wants to proceed with testing.  If the out of pocket cost of testing is less than $100 he will be billed by the genetic testing laboratory.   PLAN: After considering the risks, benefits, and limitations, Randy Barrera  provided informed consent to pursue genetic  testing and the blood sample was sent to [facility] for analysis of the common hereditary cancer panel. Results should be available within approximately 2-3 weeks' time, at which point they will be disclosed by telephone to Randy Barrera, as will any additional recommendations warranted by these results. Randy Barrera will receive a summary of his genetic counseling visit and a copy of his results once available. This information will also be available in Epic. We encouraged Randy Barrera to remain in contact with cancer genetics annually so that we can continuously update the family history and inform him of any changes in cancer genetics and testing that may be of benefit for his family. Randy Barrera questions were answered to his satisfaction today. Our contact information was provided should additional questions or concerns arise.  Based on Randy Barrera family history, we  recommended his brother, who was diagnosed with prostate cancer at age 21, have genetic counseling and testing. Randy Barrera will let us know if we can be of any assistance in coordinating genetic counseling and/or testing for this family member.   Lastly, we encouraged Randy Barrera to remain in contact with cancer genetics annually so that we can continuously update the family history and inform him of any changes in cancer genetics and testing that may be of benefit for this family.   Mr.  Barrera questions were answered to his satisfaction today. Our contact information was provided should additional questions or concerns arise. Thank you for the referral and allowing [place] to share in the care of your patient.   Randy Barrera, [place], [facility] Certified Genetic Counselor [email or web address] phone: [phone]  The patient was seen for a total of 40 minutes in face-to-face genetic counseling.  This patient was discussed with Drs. Magrinat, Lindi Adie and/or Burr Medico who agrees with the above.     _______________________________________________________________________ For Office Staff:  Number of people involved in session: 1 Was an Intern/ student involved with case: no

## 2018-02-24 ENCOUNTER — Encounter: Payer: Self-pay | Admitting: Genetic Counselor

## 2018-02-24 ENCOUNTER — Telehealth: Payer: Self-pay | Admitting: Genetic Counselor

## 2018-02-24 DIAGNOSIS — Z1379 Encounter for other screening for genetic and chromosomal anomalies: Secondary | ICD-10-CM | POA: Insufficient documentation

## 2018-02-24 NOTE — Telephone Encounter (Signed)
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  This does not explain the cancer in the family.

## 2018-02-25 ENCOUNTER — Ambulatory Visit: Payer: Self-pay | Admitting: Genetic Counselor

## 2018-02-25 DIAGNOSIS — Z1379 Encounter for other screening for genetic and chromosomal anomalies: Secondary | ICD-10-CM

## 2018-02-25 NOTE — Progress Notes (Signed)
HPI:  [name] was previously seen in the [place] clinic due to a personal and family history of cancer and concerns regarding a hereditary predisposition to cancer. Please refer to our prior cancer genetics clinic note for more information regarding [name] medical, social and family histories, and our assessment and recommendations, at the time. [name] recent genetic test results were disclosed to him, as were recommendations warranted by these results. These results and recommendations are discussed in more detail below.  CANCER HISTORY:    Prostate cancer [facility])    Initial Diagnosis    Prostate cancer ([place])    02/19/2018 Genetic Testing    MUTYH c.1187G>A single pathogenic variant identifed on the common hereditary cancer panel.  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  The Hereditary Gene Panel offered by [facility] includes sequencing and/or deletion duplication testing of the following 47 genes: APC, ATM, AXIN2, BARD1, BMPR1A, BRCA1, BRCA2, BRIP1, CDH1, CDK4, CDKN2A (p14ARF), CDKN2A (p16INK4a), CHEK2, CTNNA1, DICER1, EPCAM (Deletion/duplication testing only), GREM1 (promoter region deletion/duplication testing only), KIT, MEN1, MLH1, MSH2, MSH3, MSH6, MUTYH, NBN, NF1, NHTL1, PALB2, PDGFRA, PMS2, POLD1, POLE, PTEN, RAD50, RAD51C, RAD51D, SDHB, SDHC, SDHD, SMAD4, SMARCA4. STK11, TP53, TSC1, TSC2, and VHL.  The following genes were evaluated for sequence changes only: SDHA and HOXB13 c.251G>A variant only. The report date is February 19, 2018.     FAMILY HISTORY:  We obtained a detailed, 4-generation family history.  Significant diagnoses are listed below: Family History  Problem Relation Age of Onset  . Prostate cancer Father   . Prostate cancer Brother     The patient has one daughter who is cancer free.  He has three brothers, one who was diagnosed with prostate cancer at age 2. Both parents are deceased.  The patient's  mother had COPD.  She had a brother and sister who are reportedly cancer free.  Her parents are deceased, the grandmother had some form of cancer.  The grandfather had liver cirrhosis.  The patient's father was diagnosed with prostate cancer around age 46.  He had one sister who was cancer free.  There is no other reported family history of cancer.  [name] is unaware of previous family history of genetic testing for hereditary cancer risks. Patient's maternal ancestors are of Caucasian descent. There is no reported Ashkenazi Jewish ancestry. There is no known consanguinity.   GENETIC TEST RESULTS: Genetic testing reported out on February 24, 2018 through the common hereditary cancer panel  revealed one pathogenic mutation in a gene called MUTYH (also known as MYH). There were no other deleterious mutations. 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     We discussed with [name] that since the current genetic testing is not perfect, it is possible there may be a gene mutation in another of these genes that current testing cannot detect, but that chance is small.  We also discussed, that it is possible that another gene that has not yet been discovered, or that we have not yet tested, is responsible for the cancer diagnoses in the family, and it is, therefore, important to remain in touch with cancer genetics in the future so that we can continue to offer [name] the most up to date genetic testing.   CANCER SCREENING RECOMMENDATIONS:  Because of the recessive nature of this condition, this result is reassuring and indicates that [name] likely does not have an increased risk for a future cancer due to  a mutation in one of these genes. This normal test also suggests that [name] cancer was most likely not due to an inherited predisposition associated with one of these genes.  Most cancers happen by chance and this negative test suggests that his cancer falls into this category.  We,  therefore, recommended he continue to follow the cancer management and screening guidelines provided by his oncology and primary healthcare provider.   An individual's cancer risk and medical management are not determined by genetic test results alone. Overall cancer risk assessment incorporates additional factors, including personal medical history, family history, and any available genetic information that may result in a personalized plan for cancer prevention and surveillance.  We reviewed recessive inheritance and discussed when an individual has two MYH gene mutations, this is associated with an increased risk for adenomatous (precancerous) colon polyps. Recently, the [employer]  (NCCN: V.1.2020) provided national guidelines to manage the colon cancer risk found with single (heterozygous) MUTYH mutation.  These guidelines include having a MUTYH pathogenic mutation and:  Personal history of colon cancer  Follow instructions provided by your physician based on your personal history.  Do not have a personal history of colon cancer but have a parent/sibling/child with colon cancer: Colonoscopy every 5 years starting at age 13 or 65 years younger than the earliest age of onset, whichever is younger.  Do not have a personal history of colon cancer and do not have a parent/sibling/child with colon cancer: Data is uncertain whether specialized screening is needed.  However, to our current knowledge, individuals with only one MYH gene mutation do not have an increased risk for colon polyps. This result does not explain his cancer diagnosis, nor that in the family.  RECOMMENDATIONS FOR FAMILY MEMBERS:  Individuals in this family might be at some increased risk of developing cancer, over the general population risk, simply due to the family history of cancer, or possibly the MUTYH genetic mutation.  We recommended women in this family have a yearly mammogram beginning at age 48,  or 65 years younger than the earliest onset of cancer, an annual clinical breast exam, and perform monthly breast self-exams. Women in this family should also have a gynecological exam as recommended by their primary provider. All family members should have a colonoscopy by age 28, or the instructions above if they test positive for the MUTYH mutation.  Based on [name] family history, we recommended his daughter and brothers undergo genetic testing for MUTYH mutations.  We also recommend that they undergo testing not just for this single mutation, but also for any others, in order to confirm that they do not have MYH-associated polyposis.  [name] will let us know if we can be of any assistance in coordinating genetic counseling and/or testing for this family member.   FOLLOW-UP: Lastly, we discussed with [name] that cancer genetics is a rapidly advancing field and it is possible that new genetic tests will be appropriate for him and/or his family members in the future. We encouraged him to remain in contact with cancer genetics on an annual basis so we can update his personal and family histories and let him know of advances in cancer genetics that may benefit this family.   Our contact number was provided. [name] questions were answered to his satisfaction, and he knows he is welcome to call us at anytime with additional questions or concerns.   Roma Kayser, MS, [facility] Certified Genetic Counselor [email or web address]

## 2018-08-31 ENCOUNTER — Other Ambulatory Visit: Payer: Self-pay | Admitting: Urology

## 2018-08-31 DIAGNOSIS — C61 Malignant neoplasm of prostate: Secondary | ICD-10-CM

## 2018-11-19 ENCOUNTER — Other Ambulatory Visit: Payer: Self-pay

## 2018-11-19 ENCOUNTER — Ambulatory Visit
Admission: RE | Admit: 2018-11-19 | Discharge: 2018-11-19 | Disposition: A | Discharge status: Home / Self Care | Payer: 59 | Source: Ambulatory Visit | Attending: Urology | Admitting: Urology

## 2018-11-19 DIAGNOSIS — C61 Malignant neoplasm of prostate: Secondary | ICD-10-CM

## 2018-11-19 MED ORDER — GADOBENATE DIMEGLUMINE 529 MG/ML IV SOLN
8.0000 mL | Freq: Once | INTRAVENOUS | Status: AC | PRN
  Administered 2018-11-19: 8 mL via INTRAVENOUS

## 2019-01-15 HISTORY — PX: PROSTATE BIOPSY: SHX241

## 2019-02-23 ENCOUNTER — Encounter: Payer: Self-pay | Admitting: *Deleted

## 2019-03-03 ENCOUNTER — Encounter: Payer: Self-pay | Admitting: Radiation Oncology

## 2019-03-03 DIAGNOSIS — I10 Essential (primary) hypertension: Secondary | ICD-10-CM | POA: Insufficient documentation

## 2019-03-03 NOTE — Progress Notes (Signed)
GU Location of Tumor / Histology: prostatic adenocarcinoma  If Prostate Cancer, Gleason Score is (3 + 4) and PSA is (2.50). Prostate volume: 42 cc  Will Chavez was initially diagnosed with low risk prostate cancer in November 2019. He elected for active surveillance.  Biopsies of prostate (if applicable) revealed:    Past/Anticipated interventions by urology, if any: prostate biopsy, active surveillance, prostate biopsy, referral to Dr. Tammi Klippel for consideration of brachytherapy  Past/Anticipated interventions by medical oncology, if any: no  Weight changes, if any: no  Bowel/Bladder complaints, if any: IPSS 3. SHIM 4. Reports he has not been responsive to ED medication. Denies dysuria, hematuria or leakage. Denies any bowel complaints.    Nausea/Vomiting, if any: no  Pain issues, if any:  no  SAFETY ISSUES:  Prior radiation? no  Pacemaker/ICD? no  Possible current pregnancy? no, male patient  Is the patient on methotrexate? no  Current Complaints / other details:  64 year old male. Married with one daughter. Father and brother with hx of prostate ca. Maternal Grandmother had cancer as well but patient is unaware of type.

## 2019-03-05 ENCOUNTER — Ambulatory Visit
Admission: RE | Admit: 2019-03-05 | Discharge: 2019-03-05 | Disposition: A | Discharge status: Home / Self Care | Payer: BC Managed Care – PPO | Source: Ambulatory Visit | Attending: Radiation Oncology | Admitting: Radiation Oncology

## 2019-03-05 ENCOUNTER — Other Ambulatory Visit: Payer: Self-pay

## 2019-03-05 ENCOUNTER — Encounter: Payer: Self-pay | Admitting: Radiation Oncology

## 2019-03-05 VITALS — Ht 72.0 in | Wt 185.0 lb

## 2019-03-05 DIAGNOSIS — C61 Malignant neoplasm of prostate: Secondary | ICD-10-CM

## 2019-03-05 NOTE — Progress Notes (Signed)
See progress note under physician encounter. 

## 2019-03-05 NOTE — Progress Notes (Signed)
Radiation Oncology         [phone] ________________________________  Initial outpatient Consultation - Conducted via Telephone due to current COVID-19 concerns for limiting patient exposure  Name: Randy Barrera MRN: [id number]  Date: 03/05/2019  DOB: [birth year]  LK:TGYB, Dwyane Luo, MD  Raynelle Bring, MD   REFERRING PHYSICIAN: Raynelle Bring, MD  DIAGNOSIS: 64 y.o. gentleman with Stage T1c adenocarcinoma of the prostate with Gleason score of 3+4, and PSA of 2.5.    ICD-10-CM   1. Prostate cancer [facility])  C61     HISTORY OF PRESENT ILLNESS: Randy Barrera is a 63 y.o. male with a diagnosis of prostate cancer. He was noted to have rising PSA of 2.9, up from 2.46 four months prior, by his primary care physician, Dr. Harrington Challenger.  Given the rising PSA in addition to having a strong family history of prostate cancer with his father passing in his 77s from metastatic prostate cancer, and his brother treated with prostatectomy for prostate cancer in his 28's, he was referred for evaluation in urology by Dr. Jeffie Pollock on 10/27/2017. Digital rectal examination was performed at that time revealing no nodules.  His initial biopsy performed on 12/03/2017 showed Gleason 3+3 prostate cancer in 40% of one core.  Prostate volume measured 41 cc.  PSA performed that day was stable at 2.83 so he opted to proceed in active surveillance at that time.  His PSA decreased to 2.12 in 03/2018 and slightly increased at 2.22 in 08/2018 and further increased at 2.5 at the time of his most recent evaluation in 11/2018. Therefore, as part of his active surveillance, he underwent prostate MRI on 11/19/2018 which demonstrated a PI-RADS 3 lesion in left peripheral zone of prostate. The patient proceeded to MRI fusion biopsy on 01/28/2019.  The prostate volume measured 42 cc.  Out of 16 core biopsies, 7 were positive.  The maximum Gleason score was 3+4, and this was seen in all 3 samples from the ROI MRI lesion, as well as the left base  lateral (small focus), left base, and right apex lateral. Additionally, Gleason 3+3 was seen in a small focus in left mid.  After review of his biopsy results with Dr. Jeffie Pollock, he appeared to be leaning towards prostatectomy so he met with Dr. Alinda Money on 02/23/2019 to discuss surgical options further. Per Dr. Lynne Logan note, at the conclusion of their conversation the patient appeared to be leaning towards brachytherapy and thus he has kindly been referred today for discussion of potential radiation treatment options.  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   PREVIOUS RADIATION THERAPY: No  PAST MEDICAL HISTORY:  Past Medical History:  Diagnosis Date  . Family history of prostate cancer   . Foot fracture 2015  . Hypertension   . Prostate cancer ([place])       PAST SURGICAL HISTORY: Past Surgical History:  Procedure Laterality Date  . FOOT FRACTURE SURGERY Right 2015  . PROSTATE BIOPSY      FAMILY HISTORY:  Family History  Problem Relation Age of Onset  . Prostate cancer Father   . Prostate cancer Brother   . Cancer Paternal Grandmother   . Breast cancer Neg Hx   . Colon cancer Neg Hx   . Pancreatic cancer Neg Hx     SOCIAL HISTORY:  Social History   Socioeconomic History  . Marital status: Married    Spouse name: Pamala Hurry  . Number of children: 1  . Years of  education: 16  . Highest education level: Not on file  Occupational History    Comment: [employer]  Tobacco Use  . Smoking status: Former Smoker    Packs/day: 2.00    Years: 20.00    Pack years: 40.00    Types: Cigarettes    Quit date: 10/05/1994    Years since quitting: 24.4  . Smokeless tobacco: Never Used  Substance and Sexual Activity  . Alcohol use: Yes    Alcohol/week: 2.0 standard drinks    Types: 2 Cans of beer per week    Comment: 2 beers a day  . Drug use: No  . Sexual activity: Not Currently  Other Topics Concern  . Not on file  Social History  Narrative   Lives at home with wife    Caffeine use- coffee- 2 daily   Social Determinants of Health   Financial Resource Strain:   . Difficulty of Paying Living Expenses: Not on file  Food Insecurity:   . Worried About Charity fundraiser in the Last Year: Not on file  . Ran Out of Food in the Last Year: Not on file  Transportation Needs:   . Lack of Transportation (Medical): Not on file  . Lack of Transportation (Non-Medical): Not on file  Physical Activity:   . Days of Exercise per Week: Not on file  . Minutes of Exercise per Session: Not on file  Stress:   . Feeling of Stress : Not on file  Social Connections:   . Frequency of Communication with Friends and Family: Not on file  . Frequency of Social Gatherings with Friends and Family: Not on file  . Attends Religious Services: Not on file  . Active Member of Clubs or Organizations: Not on file  . Attends Archivist Meetings: Not on file  . Marital Status: Not on file  Intimate Partner Violence:   . Fear of Current or Ex-Partner: Not on file  . Emotionally Abused: Not on file  . Physically Abused: Not on file  . Sexually Abused: Not on file    ALLERGIES: Lisinopril  MEDICATIONS:  Current Outpatient Medications  Medication Sig Dispense Refill  . acetaminophen (TYLENOL) 500 MG tablet Hold this medication while you are taking the narcotic pain pain medication    . doxazosin (CARDURA) 2 MG tablet Take by mouth.    . tadalafil (CIALIS) 20 MG tablet Take by mouth.    Marland Kitchen amLODipine (NORVASC) 10 MG tablet     . ESOMEPRAZOLE MAGNESIUM PO Take by mouth.    . fluorouracil (EFUDEX) 5 % cream Apply 1 application topically 2 (two) times daily.    Marland Kitchen ibuprofen (ADVIL) 600 MG tablet Take 600 mg by mouth every 6 (six) hours as needed.    . rosuvastatin (CRESTOR) 10 MG tablet     . traZODone (DESYREL) 50 MG tablet Take by mouth.    . traZODone (DESYREL) 50 MG tablet Take 100 mg by mouth at bedtime as needed.    . zolpidem  (AMBIEN) 5 MG tablet Take by mouth.     No current facility-administered medications for this encounter.    REVIEW OF SYSTEMS:  On review of systems, the patient reports that he is doing well overall. He denies any chest pain, shortness of breath, cough, fevers, chills, night sweats, unintended weight changes. He denies any bowel disturbances, and denies abdominal pain, nausea or vomiting. He denies any new musculoskeletal or joint aches or pains. His IPSS was 3, indicating mild urinary  symptoms. His SHIM was 4, indicating he has severe erectile dysfunction. He reports he has been on medication since his 24's and has tried injections, but this did not respond to oral medications and he had priapism with injections. A complete review of systems is obtained and is otherwise negative.    PHYSICAL EXAM:  Wt Readings from Last 3 Encounters:  03/05/19 185 lb (83.9 kg)  03/23/15 189 lb 9.6 oz (86 kg)  02/14/15 193 lb 3.2 oz (87.6 kg)   Temp Readings from Last 3 Encounters:  01/16/15 98.2 F (36.8 C) (Oral)   BP Readings from Last 3 Encounters:  03/23/15 (!) 159/95  02/14/15 (!) 154/96  01/16/15 (!) 158/103   Pulse Readings from Last 3 Encounters:  03/23/15 65  02/14/15 (!) 51  01/16/15 (!) 56   Pain Assessment Pain Score: 0-No pain/10  Physical exam not performed in light of telephone consult visit format.   KPS = 100  100 - Normal; no complaints; no evidence of disease. 90   - Able to carry on normal activity; minor signs or symptoms of disease. 80   - Normal activity with effort; some signs or symptoms of disease. 55   - Cares for self; unable to carry on normal activity or to do active work. 60   - Requires occasional assistance, but is able to care for most of his personal needs. 50   - Requires considerable assistance and frequent medical care. 30   - Disabled; requires special care and assistance. 72   - Severely disabled; hospital admission is indicated although death not  imminent. 41   - Very sick; hospital admission necessary; active supportive treatment necessary. 10   - Moribund; fatal processes progressing rapidly. 0     - Dead  Karnofsky DA, [place], Craver LS and [facility] [phone]) The use of the nitrogen mustards in the palliative treatment of carcinoma: with particular reference to bronchogenic carcinoma Cancer 1 634-56  LABORATORY DATA:  Lab Results  Component Value Date   WBC 4.8 01/16/2015   HGB 14.4 01/16/2015   HCT 42.8 01/16/2015   MCV 93.9 01/16/2015   PLT 207 01/16/2015   Lab Results  Component Value Date   NA 140 01/16/2015   K 4.5 01/16/2015   CL 105 01/16/2015   CO2 28 01/16/2015   Lab Results  Component Value Date   ALT 22 01/16/2015   AST 27 01/16/2015   ALKPHOS 45 01/16/2015   BILITOT 0.8 01/16/2015     RADIOGRAPHY: No results found.    IMPRESSION/PLAN: This visit was conducted via Telephone to spare the patient unnecessary potential exposure in the healthcare setting during the current COVID-19 pandemic. 1. 64 y.o. gentleman with Stage T1c adenocarcinoma of the prostate with Gleason Score of 3+4, and PSA of 2.5. We discussed the patient's workup and outlined the nature of prostate cancer in this setting. The patient's T stage, Gleason's score, and PSA put him into the favorable intermediate risk group. Accordingly, he is eligible for a variety of potential treatment options including brachytherapy, 5.5 weeks of external radiation or prostatectomy. We discussed the available radiation techniques, and focused on the details and logistics of delivery. We discussed and outlined the risks, benefits, short and long-term effects associated with radiotherapy and compared and contrasted these with prostatectomy. We discussed the role of SpaceOAR in reducing the rectal toxicity associated with radiotherapy. The patient also had questions regarding the use of SBRT to treat prostate cancer and these were answered  to his stated  satisfaction. We also offered a referral to [facility] for further discussion of SBRT, on and/or off clinical trial, if he desires.   At the end of the conversation, the patient remains undecided but appears to be leaning towards moving forward with a 5.5 week course of daily external beam radiotherapy. He plans to discuss this further with his wife over the weekend before making a final decision and has our number to call back with any further questions or concerns.  He appears to have a good understanding of his disease and our treatment recommendations which are of curative intent.  We will share our discussion with Dr. Jeffie Pollock and Dr. Alinda Money and look forward to hearing from the patient in the near future regarding his final treatment decision.   Given current concerns for patient exposure during the COVID-19 pandemic, this encounter was conducted via telephone. The patient was notified in advance and was offered a MyChart meeting to allow for face to face communication but unfortunately reported that he did not have the appropriate resources/technology to support such a visit and instead preferred to proceed with telephone consult. The patient has given verbal consent for this type of encounter. The time spent during this encounter was 75 minutes. The attendants for this meeting include Tyler Pita MD, Ashlyn Bruning PA-C, [place], and patient, Randy Barrera. During the encounter, Tyler Pita MD, Ashlyn Bruning PA-C, and scribe, Wilburn Mylar were located at [place].  Patient, Randy Barrera was located at home.    Nicholos Johns, PA-C    Tyler Pita, MD  [facility] Direct Dial: [phone]  Fax: [phone] [email or web address]  Skype  [email or web address]  This document serves as a record of services personally performed by Tyler Pita, MD and Freeman Caldron, PA-C. It was created on their behalf by  Wilburn Mylar, a trained medical scribe. The creation of this record is based on the scribe's personal observations and the provider's statements to them. This document has been checked and approved by the attending provider.

## 2019-03-09 ENCOUNTER — Telehealth: Payer: Self-pay | Admitting: Medical Oncology

## 2019-03-09 NOTE — Telephone Encounter (Signed)
Spoke with patient to introduce myself as the prostate nurse navigator and discuss my role. I was unable to meet him 2/19, when he consulted with Dr. Tammi Klippel.After some thought, he has decided to move forward with brachytherapy. We discussed the next steps and is aware, Enid Derry will contact him with appointments. I gave him my contact information and asked him to call me with questions or concerns. He was very appreciative of my call and voiced understanding.

## 2019-03-22 ENCOUNTER — Telehealth: Payer: Self-pay | Admitting: *Deleted

## 2019-03-22 NOTE — Progress Notes (Signed)
  Radiation Oncology         [phone] ________________________________  Name: Randy Barrera MRN: [id number]  Date: 03/25/2019  DOB: 1955/07/09  SIMULATION AND TREATMENT PLANNING NOTE PUBIC ARCH STUDY  [id number], Dwyane Luo, MD  Raynelle Bring, MD  DIAGNOSIS: 64 y.o. gentleman with Stage T1c adenocarcinoma of the prostate with Gleason score of 3+4, and PSA of 2.5     ICD-10-CM   1. Prostate cancer ([place])  C61     COMPLEX SIMULATION:  The patient presented today for evaluation for possible prostate seed implant. He was brought to the radiation planning suite and placed supine on the CT couch. A 3-dimensional image study set was obtained in upload to the planning computer. There, on each axial slice, I contoured the prostate gland. Then, using three-dimensional radiation planning tools I reconstructed the prostate in view of the structures from the transperineal needle pathway to assess for possible pubic arch interference. In doing so, I did not appreciate any pubic arch interference. Also, the patient's prostate volume was estimated based on the drawn structure. The volume was 41 cc.  Given the pubic arch appearance and prostate volume, patient remains a good candidate to proceed with prostate seed implant. Today, he freely provided informed written consent to proceed.    PLAN: The patient will undergo prostate seed implant.   ________________________________  Sheral Apley. Tammi Klippel, M.D.

## 2019-03-22 NOTE — Telephone Encounter (Signed)
RETURNED PATIENT'S PHONE CALL, SPOKE WITH PATIENT. ?

## 2019-03-23 ENCOUNTER — Telehealth: Payer: Self-pay | Admitting: *Deleted

## 2019-03-23 NOTE — Telephone Encounter (Signed)
Called patient to inform of implant date, spoke with patient and he is aware of this date. 

## 2019-03-24 ENCOUNTER — Telehealth: Payer: Self-pay | Admitting: *Deleted

## 2019-03-24 NOTE — Telephone Encounter (Signed)
CALLED PATIENT TO REMIND OF PRE-SEED APPTS. FOR 03-25-19, - ARRIVAL TIME- 10 AM, LVM FOR A RETURN CALL

## 2019-03-25 ENCOUNTER — Ambulatory Visit
Admission: RE | Admit: 2019-03-25 | Discharge: 2019-03-25 | Disposition: A | Discharge status: Home / Self Care | Payer: BC Managed Care – PPO | Source: Ambulatory Visit | Attending: Urology | Admitting: Urology

## 2019-03-25 ENCOUNTER — Other Ambulatory Visit (HOSPITAL_COMMUNITY): Payer: BC Managed Care – PPO

## 2019-03-25 ENCOUNTER — Other Ambulatory Visit: Payer: Self-pay

## 2019-03-25 ENCOUNTER — Ambulatory Visit (HOSPITAL_COMMUNITY)
Admission: RE | Admit: 2019-03-25 | Discharge: 2019-03-25 | Disposition: A | Discharge status: Home / Self Care | Payer: BC Managed Care – PPO | Source: Ambulatory Visit | Attending: Urology | Admitting: Urology

## 2019-03-25 ENCOUNTER — Encounter: Payer: Self-pay | Admitting: Medical Oncology

## 2019-03-25 ENCOUNTER — Encounter (HOSPITAL_COMMUNITY)
Admission: RE | Admit: 2019-03-25 | Discharge: 2019-03-25 | Disposition: A | Discharge status: Home / Self Care | Payer: BC Managed Care – PPO | Source: Ambulatory Visit | Attending: Urology | Admitting: Urology

## 2019-03-25 ENCOUNTER — Other Ambulatory Visit: Payer: Self-pay | Admitting: Urology

## 2019-03-25 ENCOUNTER — Ambulatory Visit
Admission: RE | Admit: 2019-03-25 | Discharge: 2019-03-25 | Disposition: A | Discharge status: Home / Self Care | Payer: BC Managed Care – PPO | Source: Ambulatory Visit | Attending: Radiation Oncology | Admitting: Radiation Oncology

## 2019-03-25 DIAGNOSIS — Z01811 Encounter for preprocedural respiratory examination: Secondary | ICD-10-CM | POA: Insufficient documentation

## 2019-03-25 DIAGNOSIS — C61 Malignant neoplasm of prostate: Secondary | ICD-10-CM

## 2019-03-30 ENCOUNTER — Ambulatory Visit (HOSPITAL_COMMUNITY)
Admission: RE | Admit: 2019-03-30 | Discharge: 2019-03-30 | Disposition: A | Discharge status: Home / Self Care | Payer: BC Managed Care – PPO | Source: Ambulatory Visit | Attending: Urology | Admitting: Urology

## 2019-03-30 ENCOUNTER — Other Ambulatory Visit: Payer: Self-pay

## 2019-03-30 ENCOUNTER — Other Ambulatory Visit (HOSPITAL_COMMUNITY): Payer: Self-pay | Admitting: Urology

## 2019-03-30 DIAGNOSIS — C61 Malignant neoplasm of prostate: Secondary | ICD-10-CM | POA: Diagnosis present

## 2019-04-22 ENCOUNTER — Encounter (HOSPITAL_BASED_OUTPATIENT_CLINIC_OR_DEPARTMENT_OTHER): Payer: Self-pay | Admitting: Urology

## 2019-04-22 ENCOUNTER — Other Ambulatory Visit: Payer: Self-pay

## 2019-04-22 NOTE — Progress Notes (Signed)
Spoke w/ via phone for pre-op interview---patient Lab needs dos----  None has lab appt for cbc, cmet, pt, ptt 04-26-2019 at 1130 am    And repeat chest xary with nipple markers patient aware to be done         Lab results------ekg 03-25-2019 epic COVID test ------04-26-2019 100 pm Arrive at -------1045 am 04-29-2019 NPO after ------misnight Medications to take morning of surgery -----amlodipine, nexium Diabetic medication -----n/a Patient Special Instructions -----fleets enema day of surgery Pre-Op special Istructions ----- Patient verbalized understanding of instructions that were given at this phone interview. Patient denies shortness of breath, chest pain, fever, cough a this phone interview.

## 2019-04-22 NOTE — Progress Notes (Signed)
SPOKE WITH PAM GIBOSN, DR WRENN WANTS CHEST XRAY REPEATED WITH NIPPLE MARKERS, ORDER PLACED IN Epic AND DONE WITH NIPPLE MARKERS PLACED IN COMMENTS.

## 2019-04-22 NOTE — Progress Notes (Signed)
Left message with pam gibson at [facility] urology 03-25-2019 and 03-30-2019 chest xrays recommend repeat chest xray with nipple markers to better evaluate finding.

## 2019-04-23 ENCOUNTER — Telehealth: Payer: Self-pay | Admitting: *Deleted

## 2019-04-23 NOTE — Telephone Encounter (Signed)
CALLED PATIENT TO REMIND OF LAB AND COVID TESTING FOR 04-26-19, SPOKE WITH PATIENT AND HE IS AWARE OF THESE APPTS.

## 2019-04-26 ENCOUNTER — Other Ambulatory Visit (HOSPITAL_COMMUNITY)
Admission: RE | Admit: 2019-04-26 | Discharge: 2019-04-26 | Disposition: A | Discharge status: Home / Self Care | Payer: BC Managed Care – PPO | Source: Ambulatory Visit | Attending: Pulmonary Disease | Admitting: Pulmonary Disease

## 2019-04-26 ENCOUNTER — Other Ambulatory Visit: Payer: Self-pay

## 2019-04-26 ENCOUNTER — Encounter (HOSPITAL_COMMUNITY)
Admission: RE | Admit: 2019-04-26 | Discharge: 2019-04-26 | Disposition: A | Discharge status: Home / Self Care | Payer: BC Managed Care – PPO | Source: Ambulatory Visit | Attending: Pulmonary Disease | Admitting: Pulmonary Disease

## 2019-04-26 ENCOUNTER — Other Ambulatory Visit: Payer: Self-pay | Admitting: Urology

## 2019-04-26 ENCOUNTER — Ambulatory Visit (HOSPITAL_COMMUNITY)
Admission: RE | Admit: 2019-04-26 | Discharge: 2019-04-26 | Disposition: A | Discharge status: Home / Self Care | Payer: BC Managed Care – PPO | Source: Ambulatory Visit | Attending: Urology | Admitting: Urology

## 2019-04-26 DIAGNOSIS — Z20822 Contact with and (suspected) exposure to covid-19: Secondary | ICD-10-CM | POA: Insufficient documentation

## 2019-04-26 DIAGNOSIS — R9389 Abnormal findings on diagnostic imaging of other specified body structures: Secondary | ICD-10-CM | POA: Insufficient documentation

## 2019-04-26 LAB — CBC
HCT: 43.2 % (ref 39.0–52.0)
Hemoglobin: 14.4 g/dL (ref 13.0–17.0)
MCH: 31.8 pg (ref 26.0–34.0)
MCHC: 33.3 g/dL (ref 30.0–36.0)
MCV: 95.4 fL (ref 80.0–100.0)
Platelets: 176 10*3/uL (ref 150–400)
RBC: 4.53 MIL/uL (ref 4.22–5.81)
RDW: 12 % (ref 11.5–15.5)
WBC: 4.7 10*3/uL (ref 4.0–10.5)
nRBC: 0 % (ref 0.0–0.2)

## 2019-04-26 LAB — PROTIME-INR
INR: 1 (ref 0.8–1.2)
Prothrombin Time: 13 seconds (ref 11.4–15.2)

## 2019-04-26 LAB — COMPREHENSIVE METABOLIC PANEL
ALT: 20 U/L (ref 0–44)
AST: 21 U/L (ref 15–41)
Albumin: 4.4 g/dL (ref 3.5–5.0)
Alkaline Phosphatase: 45 U/L (ref 38–126)
Anion gap: 6 (ref 5–15)
BUN: 22 mg/dL (ref 8–23)
CO2: 26 mmol/L (ref 22–32)
Calcium: 8.9 mg/dL (ref 8.9–10.3)
Chloride: 103 mmol/L (ref 98–111)
Creatinine, Ser: 1.63 mg/dL — ABNORMAL HIGH (ref 0.61–1.24)
GFR calc Af Amer: 51 mL/min — ABNORMAL LOW (ref >60)
GFR calc non Af Amer: 44 mL/min — ABNORMAL LOW (ref >60)
Glucose, Bld: 95 mg/dL (ref 70–99)
Potassium: 4.6 mmol/L (ref 3.5–5.1)
Sodium: 135 mmol/L (ref 135–145)
Total Bilirubin: 0.9 mg/dL (ref 0.3–1.2)
Total Protein: 6.8 g/dL (ref 6.5–8.1)

## 2019-04-26 LAB — APTT: aPTT: 31 seconds (ref 24–36)

## 2019-04-26 LAB — SARS CORONAVIRUS 2 (TAT 6-24 HRS): SARS Coronavirus 2: NEGATIVE

## 2019-04-27 NOTE — Progress Notes (Addendum)
Left message with Darrel Reach chest xray done 04-26-2019 with nipple markers normal, creatinnie was 1.63 on cmet done 04-26-2019.

## 2019-04-28 ENCOUNTER — Telehealth: Payer: Self-pay | Admitting: *Deleted

## 2019-04-28 NOTE — H&P (Signed)
Pt presents today for pre-operative history and physical exam in anticipation of radioactive seed and space oar placement by Dr. Jeffie Pollock on 04/29/19. He is doing well and is without complaint.   Pt denies F/C, HA, CP, SOB, N/V, diarrhea/constipation, back pain, flank pain, hematuria, and dysuria.   HX:    CC: Prostate Cancer   Physician requesting consult: Dr. Irine Seal  PCP: Dr. Melinda Crutch   [name] is a 64 year old gentleman who was initially diagnosed with very low risk prostate cancer in November 2019 with a PSA of 2.12 and 1 out of 12 biopsy cores with Gleason 3+3=6 adenocarcinoma on his TRUS biopsy from 11//20/19. He elected active surveillance management. An MRI of the prostate on 11/19/18 showed a 1.3 cm PI-RADS 3 lesion at the left base. He underwent an MR/US fusion biopsy on 01/27/18 that demonstrated Gleason 3+4=7 adenocarcinoma with 7 out of 16 biopsy cores positive including 3 out of 3 targeted ROI biopsies.   Family history: Brother and father. His father died in his mid 72s for metastatic disease. His brother was treated in his early 63s with a radical prostatectomy and is doing well.   Imaging studies:  MRI (01/28/19): No SVI, EPE, or LAD. No bone lesions.   PMH: He has a history of GERD, hypertension, hyperlipidemia, and history of gastric ulcer.  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   TNM stage: cT1c N0 Mx  PSA: 2.5  Gleason score: 3+4=7 (Grade group 2)  Biopsy (01/28/19): 7/16 cores positive  Left: L mid (5%, 3+3=6), L lateral base (<5%, 3+4=7), L base (40%, 3+4=7)  Right: R lateral apex (10%, 3+4=7)  MR target: 3/3 cores, 3+4=7 (60%, 60%, 20%)  Prostate volume: 42.0 cc   Nomogram  OC disease: 72%  EPE: 26%  SVI: 2%  LNI: 2%  PFS (5 year, 10 year): 93%, 87%   Urinary function: IPSS is 4.  Erectile function: SHIM score is 6. He has had very inconsistent ability to get an erection over many years. The PDE 5 inhibitors have not typically been helpful  for him. This is currently a lower priority.     ALLERGIES: Lisinopril - Hives    MEDICATIONS: Omeprazole 20 mg capsule,delayed release  Amlodipine Besylate 10 mg tablet  Doxazosin Mesylate 1 mg tablet  Rosuvastatin Calcium  Sildenafil Citrate 100 mg tablet 1 tablet PO prn Ta  Trazodone Hcl 100 mg tablet  Tylenol     GU PSH: Lap Donor Nephrectomy - 2019 Prostate Needle Biopsy - 01/28/2019, 12/03/2017     NON-GU PSH: Foot surgery (unspecified), Right Shoulder Arthroscopy/surgery, Right Surgical Pathology, Gross And Microscopic Examination For Prostate Needle - 01/28/2019, 12/03/2017     GU PMH: Prostate Cancer - 01/28/2019, He has a PIRADS 3 lesion on the MRI at the left base where his prior cancer was found. I will get him set up for an MR Fusion biopsy. I have reviewed the risks of bleeding, infection and voiding difficulty. Levaquin and Valium sent. , - 11/25/2018 (Stable), His PSA is fairly stable and his exam is benign. I will have him return in 3 months with a PSA and MRIP. , - 08/24/2018, His PSA is down and he is doing well. He will return in 3 months with a PSA for an exam. , - 05/22/2018, T1c Nx Mx Gleason 6 very low risk prostate cancer. I discussed AS, RALP, EXRT, seeds, Cryo and HIFU. He is most interested in AS. I discussed genomic testing and genetic counseling  with his strong family history and I will have him see the Dietitian at the [facility] F/u in 3 months with a PSA. , - 12/25/2017 ED due to arterial insufficiency, He has persistent ED with minimal response to meds. - 11/25/2018, (Stable), He is responding to sildenafil and tadalafil., - 08/24/2018 (Stable), He didn't respond to Roxana but has responded to combination sildenafil and tadalafil in the past so he will be given scripts for those meds. 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 I discussed Oral meds, another attempt at injections, VED, LIEST and IPP. He was given [place]  200mg  samples and a script. , - 12/25/2017 Right testicular pain, He has had intermittent right testicular pain that is associated with back pain and is most consistent with a referred radicular origin. - 05/22/2018 Elevated PSA - 12/03/2017, He has an elevated PSA with a strong family history of cancer. I will get him set up for a biopsy and reviewed the risks of bleeding, infection and voiding difficulty. , - 10/27/2017      PMH Notes:  1898-01-14 00:00:00 - Note: Normal Routine History And Physical Adult   NON-GU PMH: Acute gastric ulcer with hemorrhage GERD Hypercholesterolemia Hypertension Skin Cancer, History    FAMILY HISTORY: 1 Daughter - Other Emphysema - Mother Prostate Cancer - Father, Brother Tuberculosis - Father   SOCIAL HISTORY: Marital Status: Married Preferred Language: English; Race: White Current Smoking Status: Patient does not smoke anymore. Has not smoked since 10/15/1994.   Tobacco Use Assessment Completed: Used Tobacco in last 30 days? Does not use smokeless tobacco. Does drink.  Does not use drugs. Drinks 2 caffeinated drinks per day. Has not had a blood transfusion. 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     Notes: [place] 2-3 drinks per night (approx 6 ounces) most nights    REVIEW OF SYSTEMS:    GU Review Male:   Patient denies frequent urination, hard to postpone urination, burning/ pain with urination, get up at night to urinate, leakage of urine, stream starts and stops, trouble starting your stream, have to strain to urinate , erection problems, and penile pain.  Gastrointestinal (Upper):   Patient denies vomiting, indigestion/ heartburn, and nausea.  Gastrointestinal (Lower):   Patient denies diarrhea and constipation.  Constitutional:   Patient denies fever, night sweats, weight loss, and fatigue.  Skin:   Patient denies skin rash/ lesion and itching.  Eyes:   Patient denies blurred vision and double vision.  Ears/ Nose/ Throat:   Patient denies sore  throat and sinus problems.  Hematologic/Lymphatic:   Patient denies swollen glands and easy bruising.  Cardiovascular:   Patient denies leg swelling and chest pains.  Respiratory:   Patient denies cough and shortness of breath.  Endocrine:   Patient denies excessive thirst.  Musculoskeletal:   Patient denies back pain and joint pain.  Neurological:   Patient denies headaches and dizziness.  Psychologic:   Patient denies depression and anxiety.   VITAL SIGNS:      04/20/2019 02:46 PM  Weight 186 lb / 84.37 kg  BP 132/79 mmHg  Heart Rate 57 /min  Temperature 97.8 F / 36.5 C   MULTI-SYSTEM PHYSICAL EXAMINATION:    Constitutional: Well-nourished. No physical deformities. Normally developed. Good grooming.  Neck: Neck symmetrical, not swollen. Normal tracheal position.  Respiratory: Normal breath sounds. No labored breathing, no use of accessory muscles.   Cardiovascular: Regular rate and rhythm. No murmur, no gallop.   Lymphatic: No enlargement  of neck, axillae, groin.  Skin: No paleness, no jaundice, no cyanosis. No lesion, no ulcer, no rash.  Neurologic / Psychiatric: Oriented to time, oriented to place, oriented to person. No depression, no anxiety, no agitation.  Gastrointestinal: No mass, no tenderness, no rigidity, non obese abdomen.  Eyes: Normal conjunctivae. Normal eyelids.  Ears, Nose, Mouth, and Throat: Left ear no scars, no lesions, no masses. Right ear no scars, no lesions, no masses. Nose no scars, no lesions, no masses. Normal hearing. Normal lips.  Musculoskeletal: Normal gait and station of head and neck.     PAST DATA REVIEWED:  Source Of History:  Patient  Records Review:   Previous Patient Records  Urine Test Review:   Urinalysis   04/20/19  Urinalysis  Urine Appearance Clear   Urine Color Yellow   Urine Glucose Neg mg/dL  Urine Bilirubin Neg mg/dL  Urine Ketones Neg mg/dL  Urine Specific Gravity >1.030   Urine Blood Neg ery/uL  Urine pH 5.5   Urine Protein  Trace mg/dL  Urine Urobilinogen 0.2 mg/dL  Urine Nitrites Neg   Urine Leukocyte Esterase Neg leu/uL   PROCEDURES:          Urinalysis - 81003 Dipstick Dipstick Cont'd  Color: Yellow Bilirubin: Neg mg/dL  Appearance: Clear Ketones: Neg mg/dL  Specific Gravity: >1.030 Blood: Neg ery/uL  pH: 5.5 Protein: Trace mg/dL  Glucose: Neg mg/dL Urobilinogen: 0.2 mg/dL    Nitrites: Neg    Leukocyte Esterase: Neg leu/uL    ASSESSMENT:      ICD-10 Details  1 GU:   Elevated PSA - R97.20   2   Prostate Cancer - C61    PLAN:           Schedule Return Visit/Planned Activity: Keep Scheduled Appointment - Schedule Surgery          Document Letter(s):  Created for Patient: Clinical Summary         Notes:   There are no changes in the patients history or physical exam since last evaluation by Dr. Jeffie Pollock. Pt is scheduled to undergo radioactive seed and space oar implant on 04/29/19.   All pt's questions were answered to the best of my ability.

## 2019-04-28 NOTE — Telephone Encounter (Signed)
CALLED PATIENT TO REMIND OF PROCEDURE FOR 04-29-19, LVM FOR A RETURN CALL

## 2019-04-29 ENCOUNTER — Ambulatory Visit (HOSPITAL_BASED_OUTPATIENT_CLINIC_OR_DEPARTMENT_OTHER)
Admission: RE | Admit: 2019-04-29 | Discharge: 2019-04-29 | Disposition: A | Discharge status: Home / Self Care | Payer: BC Managed Care – PPO | Attending: Urology | Admitting: Urology

## 2019-04-29 ENCOUNTER — Ambulatory Visit (HOSPITAL_BASED_OUTPATIENT_CLINIC_OR_DEPARTMENT_OTHER): Payer: BC Managed Care – PPO | Admitting: Anesthesiology

## 2019-04-29 ENCOUNTER — Encounter (HOSPITAL_BASED_OUTPATIENT_CLINIC_OR_DEPARTMENT_OTHER): Payer: Self-pay | Admitting: Urology

## 2019-04-29 ENCOUNTER — Encounter (HOSPITAL_BASED_OUTPATIENT_CLINIC_OR_DEPARTMENT_OTHER)
Admission: RE | Disposition: A | Discharge status: Home / Self Care | Payer: Self-pay | Source: Home / Self Care | Attending: Urology

## 2019-04-29 ENCOUNTER — Ambulatory Visit (HOSPITAL_COMMUNITY): Payer: BC Managed Care – PPO

## 2019-04-29 DIAGNOSIS — K219 Gastro-esophageal reflux disease without esophagitis: Secondary | ICD-10-CM | POA: Diagnosis not present

## 2019-04-29 DIAGNOSIS — Z8711 Personal history of peptic ulcer disease: Secondary | ICD-10-CM | POA: Diagnosis not present

## 2019-04-29 DIAGNOSIS — Z888 Allergy status to other drugs, medicaments and biological substances status: Secondary | ICD-10-CM | POA: Insufficient documentation

## 2019-04-29 DIAGNOSIS — Z87891 Personal history of nicotine dependence: Secondary | ICD-10-CM | POA: Insufficient documentation

## 2019-04-29 DIAGNOSIS — C61 Malignant neoplasm of prostate: Secondary | ICD-10-CM | POA: Insufficient documentation

## 2019-04-29 DIAGNOSIS — Z79899 Other long term (current) drug therapy: Secondary | ICD-10-CM | POA: Diagnosis not present

## 2019-04-29 DIAGNOSIS — Z8042 Family history of malignant neoplasm of prostate: Secondary | ICD-10-CM | POA: Insufficient documentation

## 2019-04-29 DIAGNOSIS — Z905 Acquired absence of kidney: Secondary | ICD-10-CM | POA: Diagnosis not present

## 2019-04-29 DIAGNOSIS — I10 Essential (primary) hypertension: Secondary | ICD-10-CM | POA: Diagnosis not present

## 2019-04-29 DIAGNOSIS — E785 Hyperlipidemia, unspecified: Secondary | ICD-10-CM | POA: Diagnosis not present

## 2019-04-29 HISTORY — PX: RADIOACTIVE SEED IMPLANT: SHX5150

## 2019-04-29 HISTORY — PX: SPACE OAR INSTILLATION: SHX6769

## 2019-04-29 SURGERY — INSERTION, RADIATION SOURCE, PROSTATE
Anesthesia: General | Site: Prostate

## 2019-04-29 MED ORDER — DEXAMETHASONE SODIUM PHOSPHATE 10 MG/ML IJ SOLN
INTRAMUSCULAR | Status: DC | PRN
  Administered 2019-04-29: 5 mg via INTRAVENOUS

## 2019-04-29 MED ORDER — ACETAMINOPHEN 500 MG PO TABS
ORAL_TABLET | ORAL | Status: AC
  Filled 2019-04-29: qty 2

## 2019-04-29 MED ORDER — SODIUM CHLORIDE 0.9 % IV SOLN
INTRAVENOUS | Status: DC
  Filled 2019-04-29: qty 1000

## 2019-04-29 MED ORDER — MIDAZOLAM HCL 2 MG/2ML IJ SOLN
INTRAMUSCULAR | Status: AC
  Filled 2019-04-29: qty 2

## 2019-04-29 MED ORDER — CIPROFLOXACIN IN D5W 400 MG/200ML IV SOLN
INTRAVENOUS | Status: AC
  Filled 2019-04-29: qty 200

## 2019-04-29 MED ORDER — SODIUM CHLORIDE (PF) 0.9 % IJ SOLN
INTRAMUSCULAR | Status: DC | PRN
  Administered 2019-04-29: 3 mL via INTRAVENOUS

## 2019-04-29 MED ORDER — LIDOCAINE 2% (20 MG/ML) 5 ML SYRINGE
INTRAMUSCULAR | Status: AC
  Filled 2019-04-29: qty 5

## 2019-04-29 MED ORDER — KETOROLAC TROMETHAMINE 30 MG/ML IJ SOLN
INTRAMUSCULAR | Status: AC
  Filled 2019-04-29: qty 1

## 2019-04-29 MED ORDER — KETOROLAC TROMETHAMINE 15 MG/ML IJ SOLN
15.0000 mg | Freq: Once | INTRAMUSCULAR | Status: DC | PRN
  Filled 2019-04-29: qty 1

## 2019-04-29 MED ORDER — FENTANYL CITRATE (PF) 100 MCG/2ML IJ SOLN
25.0000 ug | INTRAMUSCULAR | Status: DC | PRN
  Filled 2019-04-29: qty 1

## 2019-04-29 MED ORDER — LACTATED RINGERS IV SOLN: INTRAVENOUS | Status: DC | PRN

## 2019-04-29 MED ORDER — KETOROLAC TROMETHAMINE 30 MG/ML IJ SOLN
INTRAMUSCULAR | Status: DC | PRN
  Administered 2019-04-29: 30 mg via INTRAVENOUS

## 2019-04-29 MED ORDER — SODIUM CHLORIDE 0.9% FLUSH
3.0000 mL | Freq: Two times a day (BID) | INTRAVENOUS | Status: DC
  Filled 2019-04-29: qty 3

## 2019-04-29 MED ORDER — ACETAMINOPHEN 325 MG PO TABS
650.0000 mg | ORAL_TABLET | ORAL | Status: DC | PRN
  Filled 2019-04-29: qty 2

## 2019-04-29 MED ORDER — SODIUM CHLORIDE 0.9 % IV SOLN
250.0000 mL | INTRAVENOUS | Status: DC | PRN
  Filled 2019-04-29: qty 250

## 2019-04-29 MED ORDER — PROPOFOL 10 MG/ML IV BOLUS
INTRAVENOUS | Status: AC
  Filled 2019-04-29: qty 20

## 2019-04-29 MED ORDER — SODIUM CHLORIDE 0.9 % IV SOLN
INTRAVENOUS | Status: AC | PRN
  Administered 2019-04-29: 1000 mL

## 2019-04-29 MED ORDER — PROPOFOL 10 MG/ML IV BOLUS
INTRAVENOUS | Status: DC | PRN
  Administered 2019-04-29: 160 mg via INTRAVENOUS

## 2019-04-29 MED ORDER — LIDOCAINE 2% (20 MG/ML) 5 ML SYRINGE
INTRAMUSCULAR | Status: DC | PRN
  Administered 2019-04-29: 60 mg via INTRAVENOUS

## 2019-04-29 MED ORDER — ONDANSETRON HCL 4 MG/2ML IJ SOLN
4.0000 mg | Freq: Once | INTRAMUSCULAR | Status: DC | PRN
  Filled 2019-04-29: qty 2

## 2019-04-29 MED ORDER — IOHEXOL 300 MG/ML  SOLN
INTRAMUSCULAR | Status: DC | PRN
  Administered 2019-04-29: 7 mL

## 2019-04-29 MED ORDER — ONDANSETRON HCL 4 MG/2ML IJ SOLN
INTRAMUSCULAR | Status: AC
  Filled 2019-04-29: qty 2

## 2019-04-29 MED ORDER — DEXAMETHASONE SODIUM PHOSPHATE 10 MG/ML IJ SOLN
INTRAMUSCULAR | Status: AC
  Filled 2019-04-29: qty 1

## 2019-04-29 MED ORDER — CIPROFLOXACIN IN D5W 400 MG/200ML IV SOLN
400.0000 mg | Freq: Two times a day (BID) | INTRAVENOUS | Status: DC
  Administered 2019-04-29: 400 mg via INTRAVENOUS
  Filled 2019-04-29: qty 200

## 2019-04-29 MED ORDER — ONDANSETRON HCL 4 MG/2ML IJ SOLN
INTRAMUSCULAR | Status: DC | PRN
  Administered 2019-04-29: 4 mg via INTRAVENOUS

## 2019-04-29 MED ORDER — OXYCODONE HCL 5 MG PO TABS
5.0000 mg | ORAL_TABLET | ORAL | Status: DC | PRN
  Filled 2019-04-29: qty 2

## 2019-04-29 MED ORDER — FENTANYL CITRATE (PF) 100 MCG/2ML IJ SOLN
INTRAMUSCULAR | Status: DC | PRN
  Administered 2019-04-29: 50 ug via INTRAVENOUS

## 2019-04-29 MED ORDER — HYDROCODONE-ACETAMINOPHEN 5-325 MG PO TABS: 1.0000 | ORAL_TABLET | Freq: Four times a day (QID) | ORAL | 0 refills | Status: DC | PRN

## 2019-04-29 MED ORDER — MORPHINE SULFATE (PF) 2 MG/ML IV SOLN
2.0000 mg | INTRAVENOUS | Status: DC | PRN
  Filled 2019-04-29: qty 1

## 2019-04-29 MED ORDER — FENTANYL CITRATE (PF) 100 MCG/2ML IJ SOLN
INTRAMUSCULAR | Status: AC
  Filled 2019-04-29: qty 2

## 2019-04-29 MED ORDER — SODIUM CHLORIDE 0.9% FLUSH
3.0000 mL | INTRAVENOUS | Status: DC | PRN
  Filled 2019-04-29: qty 3

## 2019-04-29 MED ORDER — ACETAMINOPHEN 500 MG PO TABS
1000.0000 mg | ORAL_TABLET | Freq: Once | ORAL | Status: AC
  Administered 2019-04-29: 1000 mg via ORAL
  Filled 2019-04-29: qty 2

## 2019-04-29 MED ORDER — ACETAMINOPHEN 650 MG RE SUPP
650.0000 mg | RECTAL | Status: DC | PRN
  Filled 2019-04-29: qty 1

## 2019-04-29 MED ORDER — MIDAZOLAM HCL 2 MG/2ML IJ SOLN
INTRAMUSCULAR | Status: DC | PRN
  Administered 2019-04-29: 2 mg via INTRAVENOUS

## 2019-04-29 SURGICAL SUPPLY — 37 items
BAG URINE DRAIN 2000ML AR STRL (UROLOGICAL SUPPLIES) ×2 IMPLANT
BLADE CLIPPER SENSICLIP SURGIC (BLADE) ×2 IMPLANT
CATH FOLEY 2WAY SLVR  5CC 16FR (CATHETERS) ×1
CATH FOLEY 2WAY SLVR 5CC 16FR (CATHETERS) ×1 IMPLANT
CATH ROBINSON RED A/P 16FR (CATHETERS) IMPLANT
CATH ROBINSON RED A/P 20FR (CATHETERS) ×2 IMPLANT
CLOTH BEACON ORANGE TIMEOUT ST (SAFETY) ×2 IMPLANT
CNTNR URN SCR LID CUP LEK RST (MISCELLANEOUS) ×2 IMPLANT
CONT SPEC 4OZ STRL OR WHT (MISCELLANEOUS) ×2
COVER BACK TABLE 60X90IN (DRAPES) ×2 IMPLANT
COVER MAYO STAND STRL (DRAPES) ×2 IMPLANT
DRSG TEGADERM 4X4.75 (GAUZE/BANDAGES/DRESSINGS) ×3 IMPLANT
DRSG TEGADERM 8X12 (GAUZE/BANDAGES/DRESSINGS) ×4 IMPLANT
GAUZE SPONGE 4X4 12PLY STRL LF (GAUZE/BANDAGES/DRESSINGS) ×1 IMPLANT
GLOVE BIO SURGEON STRL SZ7.5 (GLOVE) IMPLANT
GLOVE BIO SURGEON STRL SZ8 (GLOVE) ×1 IMPLANT
GLOVE SURG ORTHO 8.5 STRL (GLOVE) ×4 IMPLANT
GLOVE SURG SS PI 6.5 STRL IVOR (GLOVE) IMPLANT
GLOVE SURG SS PI 8.0 STRL IVOR (GLOVE) ×4 IMPLANT
GOWN STRL REUS W/ TWL XL LVL3 (GOWN DISPOSABLE) ×1 IMPLANT
GOWN STRL REUS W/TWL XL LVL3 (GOWN DISPOSABLE) ×5 IMPLANT
HOLDER FOLEY CATH W/STRAP (MISCELLANEOUS) ×1 IMPLANT
I-SEED AGX100 ×77 IMPLANT
IMPL SPACEOAR SYSTEM 10ML (Spacer) ×1 IMPLANT
IMPLANT SPACEOAR SYSTEM 10ML (Spacer) ×2 IMPLANT
IV NS 1000ML (IV SOLUTION) ×1
IV NS 1000ML BAXH (IV SOLUTION) ×1 IMPLANT
KIT TURNOVER CYSTO (KITS) ×2 IMPLANT
MARKER SKIN DUAL TIP RULER LAB (MISCELLANEOUS) ×2 IMPLANT
PACK CYSTO (CUSTOM PROCEDURE TRAY) ×2 IMPLANT
SURGILUBE 2OZ TUBE FLIPTOP (MISCELLANEOUS) ×1 IMPLANT
SUT BONE WAX W31G (SUTURE) IMPLANT
SYR 10ML LL (SYRINGE) ×2 IMPLANT
TOWEL OR 17X26 10 PK STRL BLUE (TOWEL DISPOSABLE) ×2 IMPLANT
UNDERPAD 30X30 (UNDERPADS AND DIAPERS) ×4 IMPLANT
WATER STERILE IRR 3000ML UROMA (IV SOLUTION) ×2 IMPLANT
WATER STERILE IRR 500ML POUR (IV SOLUTION) ×2 IMPLANT

## 2019-04-29 NOTE — Anesthesia Postprocedure Evaluation (Signed)
Anesthesia Post Note  Patient: Serenity Rapa  Procedure(s) Performed: RADIOACTIVE SEED IMPLANT/BRACHYTHERAPY IMPLANT (N/A Prostate) SPACE OAR INSTILLATION (N/A Prostate)     Patient location during evaluation: PACU Anesthesia Type: General Level of consciousness: awake and alert Pain management: pain level controlled Vital Signs Assessment: post-procedure vital signs reviewed and stable Respiratory status: spontaneous breathing, nonlabored ventilation, respiratory function stable and patient connected to nasal cannula oxygen Cardiovascular status: blood pressure returned to baseline and stable Postop Assessment: no apparent nausea or vomiting Anesthetic complications: no    Last Vitals:  Vitals:   04/29/19 1445 04/29/19 1533  BP: 127/81 133/89  Pulse: (!) 45 (!) 46  Resp: 14 16  Temp:  36.7 C  SpO2: 99% 100%    Last Pain:  Vitals:   04/29/19 1533  TempSrc: Oral  PainSc:                  Ryan P Ellender

## 2019-04-29 NOTE — Transfer of Care (Signed)
Immediate Anesthesia Transfer of Care Note  Patient: Randy Barrera  Procedure(s) Performed: RADIOACTIVE SEED IMPLANT/BRACHYTHERAPY IMPLANT (N/A Prostate) SPACE OAR INSTILLATION (N/A Prostate)  Patient Location: PACU  Anesthesia Type:General  Level of Consciousness: drowsy  Airway & Oxygen Therapy: Patient Spontanous Breathing and Patient connected to nasal cannula oxygen  Post-op Assessment: Report given to RN and Post -op Vital signs reviewed and stable  Post vital signs: Reviewed and stable  Last Vitals:  Vitals Value Taken Time  BP 129/85 04/29/19 1418  Temp    Pulse 56 04/29/19 1419  Resp 12 04/29/19 1419  SpO2 99 % 04/29/19 1419  Vitals shown include unvalidated device data.  Last Pain:  Vitals:   04/29/19 1127  TempSrc: Oral  PainSc: 0-No pain      Patients Stated Pain Goal: 4 ([id number])  Complications: No apparent anesthesia complications

## 2019-04-29 NOTE — Anesthesia Preprocedure Evaluation (Addendum)
Anesthesia Evaluation  Patient identified by MRN, date of birth, ID band Patient awake    Reviewed: Allergy & Precautions, NPO status , Patient's Chart, lab work & pertinent test results  Airway Mallampati: II  TM Distance: >3 FB Neck ROM: Full    Dental  (+) Missing, Chipped, Dental Advisory Given,    Pulmonary former smoker,    Pulmonary exam normal breath sounds clear to auscultation + decreased breath sounds      Cardiovascular hypertension, Pt. on medications Normal cardiovascular exam Rhythm:Regular Rate:Normal  ECG: SB, rate 45    Neuro/Psych negative neurological ROS  negative psych ROS   GI/Hepatic Neg liver ROS, GERD  Medicated and Controlled,  Endo/Other  negative endocrine ROS  Renal/GU negative Renal ROS     Musculoskeletal negative musculoskeletal ROS (+)   Abdominal   Peds  Hematology HLD   Anesthesia Other Findings PROSTATE CANCER  Reproductive/Obstetrics                          Anesthesia Physical Anesthesia Plan  ASA: II  Anesthesia Plan: General   Post-op Pain Management:    Induction: Intravenous  PONV Risk Score and Plan: 3 and Midazolam, Dexamethasone, Ondansetron and Treatment may vary due to age or medical condition  Airway Management Planned: LMA  Additional Equipment:   Intra-op Plan:   Post-operative Plan: Extubation in OR  Informed Consent: I have reviewed the patients History and Physical, chart, labs and discussed the procedure including the risks, benefits and alternatives for the proposed anesthesia with the patient or authorized representative who has indicated his/her understanding and acceptance.     Dental advisory given  Plan Discussed with: CRNA  Anesthesia Plan Comments:        Anesthesia Quick Evaluation

## 2019-04-29 NOTE — Discharge Instructions (Addendum)
Brachytherapy for Prostate Cancer, Care After ° °This sheet gives you information about how to care for yourself after your procedure. Your health care provider may also give you more specific instructions. If you have problems or questions, contact your health care provider. °What can I expect after the procedure? °After the procedure, it is common to have: °· Trouble passing urine. °· Blood in the urine or semen. °· Constipation. °· Frequent feeling of an urgent need to urinate. °· Bruising, swelling, and tenderness of the area behind the scrotum (perineum). °· Bloating and gas. °· Fatigue. °· Burning or pain in the rectum. °· Problems getting or keeping an erection (erectile dysfunction). °· Nausea. °Follow these instructions at home: °Managing pain, stiffness, and swelling °· If directed, apply ice to the affected area: °? Put ice in a plastic bag. °? Place a towel between your skin and the bag. °? Leave the ice on for 20 minutes, 2-3 times a day. °· Try not to sit directly on the area behind the scrotum. A soft cushion can help with discomfort. °Activity °· Do not drive for 24 hours if you were given a medicine to help you relax (sedative). °· Do not drive or use heavy machinery while taking prescription pain medicine. °· Rest as told by your health care provider. °· Most people can return to normal activities a few days or weeks after the procedure. Ask your health care provider what activities are safe for you. °Eating and drinking °· Drink enough fluid to keep your urine clear or pale yellow. °· Eat a healthy, balanced diet. This includes lean proteins, whole grains, and plenty of fruits and vegetables. °General instructions °· Take over-the-counter and prescription medicines only as told by your health care provider. °· Keep all follow-up visits as told by your health care provider. This is important. You may still need additional treatment. °· Do not take baths, swim, or use a hot tub until your health  care provider approves. Shower and wash the area behind the scrotum gently. °· Do not have sex for one week after the treatment, or until your health care provider approves. °· If you have permanent, low-dose brachytherapy implants: °? Limit close contact with children and pregnant women for 2 months or as told by your health care provider. This is important because of the radiation that is still active in the prostate. °? You may set off radioactive sensors, such as airport screenings. Ask your health care provider for a document that explains your treatment. °? You may be instructed to use a condom during sex for the first 2 months after low-dose brachytherapy. °Contact a health care provider if: °· You have a fever or chills. °· You do not have a bowel movement for 3-4 days after the procedure. °· You have diarrhea for 3-4 days after the procedure. °· You develop any new symptoms, such as problems with urinating or erectile dysfunction. °· You have abdomen (abdominal) pain. °· You have more blood in your urine. °Get help right away if: °· You cannot urinate. °· There is excessive bleeding from your rectum. °· You have unusual drainage coming from your rectum. °· You have severe pain in the treated area that does not go away with pain medicine. °· You have severe nausea or vomiting. °Summary °· If you have permanent, low-dose brachytherapy implants, limit close contact with children and pregnant women for 2 months or as told by your health care provider. This is important because of the radiation   that is still active in the prostate. °· Talk with your health care provider about your risk of brachytherapy side effects, such as erectile dysfunction or urinary problems. Your health care provider will be able to recommend possible treatment options. °· Keep all follow-up visits as told by your health care provider. This is important. You may need additional treatment. °This information is not intended to replace  advice given to you by your health care provider. Make sure you discuss any questions you have with your health care provider. °Document Revised: 12/13/2016 Document Reviewed: 02/02/2016 °Elsevier Patient Education © 2020 [employer] ° ° °Post Anesthesia Home Care Instructions ° °Activity: °Get plenty of rest for the remainder of the day. A responsible individual must stay with you for 24 hours following the procedure.  °For the next 24 hours, DO NOT: °-Drive a car °-Operate machinery °-Drink alcoholic beverages °-Take any medication unless instructed by your physician °-Make any legal decisions or sign important papers. ° °Meals: °Start with liquid foods such as gelatin or soup. Progress to regular foods as tolerated. Avoid greasy, spicy, heavy foods. If nausea and/or vomiting occur, drink only clear liquids until the nausea and/or vomiting subsides. Call your physician if vomiting continues. ° °Special Instructions/Symptoms: °Your throat may feel dry or sore from the anesthesia or the breathing tube placed in your throat during surgery. If this causes discomfort, gargle with warm salt water. The discomfort should disappear within 24 hours. ° °If you had a scopolamine patch placed behind your ear for the management of post- operative nausea and/or vomiting: ° °1. The medication in the patch is effective for 72 hours, after which it should be removed.  Wrap patch in a tissue and discard in the trash. Wash hands thoroughly with soap and water. °2. You may remove the patch earlier than 72 hours if you experience unpleasant side effects which may include dry mouth, dizziness or visual disturbances. °3. Avoid touching the patch. Wash your hands with soap and water after contact with the patch. °   ° ° °

## 2019-04-29 NOTE — Anesthesia Procedure Notes (Signed)
Procedure Name: LMA Insertion Date/Time: 04/29/2019 1:08 PM Performed by: Wanita Chamberlain, CRNA Pre-anesthesia Checklist: Patient identified, Emergency Drugs available, Suction available and Patient being monitored Patient Re-evaluated:Patient Re-evaluated prior to induction Oxygen Delivery Method: Circle system utilized Preoxygenation: Pre-oxygenation with 100% oxygen Induction Type: IV induction Ventilation: Mask ventilation without difficulty LMA: LMA inserted LMA Size: 4.0 Number of attempts: 1 Airway Equipment and Method: Bite block Placement Confirmation: breath sounds checked- equal and bilateral,  CO2 detector and positive ETCO2 Tube secured with: Tape Dental Injury: Teeth and Oropharynx as per pre-operative assessment

## 2019-04-29 NOTE — Interval H&P Note (Signed)
History and Physical Interval Note:  04/29/2019 12:48 PM  Randy Barrera  has presented today for surgery, with the diagnosis of PROSTATE CANCER.  The various methods of treatment have been discussed with the patient and family. After consideration of risks, benefits and other options for treatment, the patient has consented to  Procedure(s): RADIOACTIVE SEED IMPLANT/BRACHYTHERAPY IMPLANT (N/A) SPACE OAR INSTILLATION (N/A) as a surgical intervention.  The patient's history has been reviewed, patient examined, no change in status, stable for surgery.  I have reviewed the patient's chart and labs.  Questions were answered to the patient's satisfaction.     Irine Seal

## 2019-04-29 NOTE — Op Note (Signed)
PATIENT:  Lanier Prude  PRE-OPERATIVE DIAGNOSIS:  Adenocarcinoma of the prostate  POST-OPERATIVE DIAGNOSIS:  Same  PROCEDURE:  Procedure(s): 1. I-125 radioactive seed implantation 2. SpaceOAR implantation. 3.  Cystoscopy  SURGEON:  Surgeon(s): Irine Seal MD  Radiation oncologist: Dr. Tyler Pita  ANESTHESIA:  General  EBL:  Minimal  DRAINS: 71 French Foley catheter  INDICATION: Galyn Klemens is a 64 y.o. with Stage T1c, Gleason 7(3+4) prostate cancer who has elected brachytherapy for treatment.  Description of procedure: After informed consent the patient was brought to the major OR, placed on the table and administered general anesthesia. He was then moved to the modified lithotomy position with his perineum perpendicular to the floor. His perineum and genitalia were then sterilely prepped. An official timeout was then performed. A 16 French Foley catheter was then placed in the bladder and filled with dilute contrast, a rectal tube was placed in the rectum and the transrectal ultrasound probe was placed in the rectum and affixed to the stand. He was then sterilely draped.  The sterile grid was installed.   Anchor needles were then placed.   Real time ultrasonography was used along with the seed planning software spot-pro version 3.1-00. This was used to develop the seed plan including the number of needles as well as number of seeds required for complete and adequate coverage. Real-time ultrasonography was then used along with the previously developed plan  to implant a total of 77 seeds using 22 needles for a target dose of 145 Gy. This proceeded without difficulty or complication.  The anchor needles and guide were removed and the SpaceOAR needle was passed under US guidance into the fat stripe posterior to the prostate with the tip in the midline at mid prostate. A puff of NS confirmed appropriate positioning and the SpaceOAR polymer was then injected over 10 seconds into the  space with excellent distribution.     A Foley catheter was then removed as well as the transrectal ultrasound probe and rectal probe. Flexible cystoscopy was then performed using the 17 French flexible scope which revealed a normal urethra throughout its length down to the sphincter which appeared intact. The prostatic urethra was 3 cm with bilobar hyperplasia. The bladder was then entered and fully and systematically inspected. There was mild trabeculation.  The ureteral orifices were noted to be of normal configuration and position. The mucosa revealed no evidence of tumors. There were also no stones identified within the bladder.  No seeds or spacers were seen and/or removed from the bladder.  The cystoscope was then removed.  The drapes were removed.  The perineum was cleaned and dressed.  He was taken out of the lithotomy position and was awakened and taken to recovery room in stable and satisfactory condition. He tolerated procedure well and there were no intraoperative complications.

## 2019-04-30 NOTE — Progress Notes (Signed)
  Radiation Oncology         [phone] ________________________________  Name: Garek Schuneman MRN: [id number]  Date: 04/30/2019  DOB: Apr 05, 1955       Prostate Seed Implant  VW:PVXY, Dwyane Luo, MD  No ref. provider found  DIAGNOSIS:  Oncology History  Prostate cancer [facility])   Initial Diagnosis   Prostate cancer ([place])   02/19/2018 Genetic Testing   MUTYH c.1187G>A single pathogenic variant identifed on the common hereditary cancer panel.  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  The Hereditary Gene Panel offered by [facility] includes sequencing and/or deletion duplication testing of the following 47 genes: APC, ATM, AXIN2, BARD1, BMPR1A, BRCA1, BRCA2, BRIP1, CDH1, CDK4, CDKN2A (p14ARF), CDKN2A (p16INK4a), CHEK2, CTNNA1, DICER1, EPCAM (Deletion/duplication testing only), GREM1 (promoter region deletion/duplication testing only), KIT, MEN1, MLH1, MSH2, MSH3, MSH6, MUTYH, NBN, NF1, NHTL1, PALB2, PDGFRA, PMS2, POLD1, POLE, PTEN, RAD50, RAD51C, RAD51D, SDHB, SDHC, SDHD, SMAD4, SMARCA4. STK11, TP53, TSC1, TSC2, and VHL.  The following genes were evaluated for sequence changes only: SDHA and HOXB13 c.251G>A variant only. The report date is February 19, 2018.   01/28/2019 Cancer Staging   Staging form: Prostate, AJCC 8th Edition - Clinical stage from 01/28/2019: Stage IIB (cT1c, cN0, cM0, PSA: 2.5, Grade Group: 2) - Signed by Freeman Caldron, PA-C on 03/05/2019     No diagnosis found.  PROCEDURE: Insertion of radioactive I-125 seeds into the prostate gland.  RADIATION DOSE: 145 Gy, definitive therapy.  TECHNIQUE: Jameal Razzano was brought to the operating room with the urologist. He was placed in the dorsolithotomy position. He was catheterized and a rectal tube was inserted. The perineum was shaved, prepped and draped. The ultrasound probe was then introduced into the rectum to see the prostate gland.  TREATMENT DEVICE: A needle grid was attached to the  ultrasound probe stand and anchor needles were placed.  3D PLANNING: The prostate was imaged in 3D using a sagittal sweep of the prostate probe. These images were transferred to the planning computer. There, the prostate, urethra and rectum were defined on each axial reconstructed image. Then, the software created an optimized 3D plan and a few seed positions were adjusted. The quality of the plan was reviewed using [facility] information for the target and the following two organs at risk:  Urethra and Rectum.  Then the accepted plan was printed and handed off to the radiation therapist.  Under my supervision, the custom loading of the seeds and spacers was carried out and loaded into sealed vicryl sleeves.  These pre-loaded needles were then placed into the needle holder.Marland Kitchen  PROSTATE VOLUME STUDY:  Using transrectal ultrasound the volume of the prostate was verified to be 50.2 cc.  SPECIAL TREATMENT PROCEDURE/SUPERVISION AND HANDLING: The pre-loaded needles were then delivered under sagittal guidance. A total of 22 needles were used to deposit 77 seeds in the prostate gland. The individual seed activity was 0.489 mCi.  SpaceOAR:  Yes  COMPLEX SIMULATION: At the end of the procedure, an anterior radiograph of the pelvis was obtained to document seed positioning and count. Cystoscopy was performed to check the urethra and bladder.  MICRODOSIMETRY: At the end of the procedure, the patient was emitting 0.22 mR/hr at 1 meter. Accordingly, he was considered safe for hospital discharge.  PLAN: The patient will return to the radiation oncology clinic for post implant CT dosimetry in three weeks.   ________________________________  Sheral Apley Tammi Klippel, M.D.

## 2019-05-05 DIAGNOSIS — R52 Pain, unspecified: Secondary | ICD-10-CM | POA: Insufficient documentation

## 2019-05-05 DIAGNOSIS — M19042 Primary osteoarthritis, left hand: Secondary | ICD-10-CM | POA: Insufficient documentation

## 2019-05-13 ENCOUNTER — Telehealth: Payer: Self-pay | Admitting: *Deleted

## 2019-05-13 ENCOUNTER — Inpatient Hospital Stay: Admission: RE | Admit: 2019-05-13 | Payer: Self-pay | Source: Ambulatory Visit | Admitting: Urology

## 2019-05-13 ENCOUNTER — Ambulatory Visit: Payer: BC Managed Care – PPO | Admitting: Radiation Oncology

## 2019-05-13 NOTE — Telephone Encounter (Signed)
Called patient to inform of post seed appts. for 05-19-19 and his MRI for 05-20-19, spoke with patient and he is aware of these appts.

## 2019-05-18 ENCOUNTER — Telehealth: Payer: Self-pay | Admitting: *Deleted

## 2019-05-18 NOTE — Telephone Encounter (Signed)
CALLED PATIENT TO REMIND OF POST SEED APPTS. FOR 05-19-19 AND HIS MRI FOR 05-20-19 -ARRIVAL TIME- 6:30 PM, SPOKE WITH PATIENT AND HE IS AWARE OF THESE APPTS.

## 2019-05-19 ENCOUNTER — Other Ambulatory Visit: Payer: Self-pay

## 2019-05-19 ENCOUNTER — Ambulatory Visit
Admission: RE | Admit: 2019-05-19 | Discharge: 2019-05-19 | Disposition: A | Discharge status: Home / Self Care | Payer: BC Managed Care – PPO | Source: Ambulatory Visit | Attending: Radiation Oncology | Admitting: Radiation Oncology

## 2019-05-19 ENCOUNTER — Ambulatory Visit
Admission: RE | Admit: 2019-05-19 | Discharge: 2019-05-19 | Disposition: A | Discharge status: Home / Self Care | Payer: BC Managed Care – PPO | Source: Ambulatory Visit | Attending: Urology | Admitting: Urology

## 2019-05-19 VITALS — BP 131/86 | HR 57 | Temp 98.3°F | Resp 20 | Ht 72.0 in

## 2019-05-19 DIAGNOSIS — Z79899 Other long term (current) drug therapy: Secondary | ICD-10-CM | POA: Diagnosis not present

## 2019-05-19 DIAGNOSIS — C61 Malignant neoplasm of prostate: Secondary | ICD-10-CM

## 2019-05-19 DIAGNOSIS — Z923 Personal history of irradiation: Secondary | ICD-10-CM | POA: Diagnosis not present

## 2019-05-19 DIAGNOSIS — R3911 Hesitancy of micturition: Secondary | ICD-10-CM | POA: Diagnosis not present

## 2019-05-19 DIAGNOSIS — R350 Frequency of micturition: Secondary | ICD-10-CM | POA: Diagnosis not present

## 2019-05-19 NOTE — Progress Notes (Signed)
Radiation Oncology         [phone] ________________________________  Name: Randy Barrera MRN: [id number]  Date: 05/19/2019  DOB: 02-27-1955  Post-Seed Follow-Up Visit Note  CC: Lawerance Cruel, MD  Lawerance Cruel, MD  Diagnosis:   64 y.o. gentleman with Stage T1c adenocarcinoma of the prostate with Gleason score of 3+4, and PSA of 2.5.    ICD-10-CM   1. Prostate cancer ([place])  C61     Interval Since Last Radiation:  3 weeks 04/29/2019:  Insertion of radioactive I-125 seeds into the prostate gland; 145 Gy, definitive therapy with placement of SpaceOAR gel.  Narrative:  The patient returns today for routine follow-up.  He is complaining of increased urinary frequency and urinary hesitation symptoms. He filled out a questionnaire regarding urinary function today providing and overall IPSS score of 15 characterizing his symptoms as moderate with occasional difficulty starting his stream with some straining if he postpones voiding for an extended period of time, intermittency, hesitancy and nocturia x2.  His pre-implant score was 3. He continues taking Cardura daily for his BP and this seems to improve his LUTS as well.  He specifically denies dysuria, gross hematuria, excessive frequency,urgency or incontinence.  He reports a healthy appetite and denies any abdominal pain or bowel symptoms. Overall, he is quite pleased with his progress to date.  ALLERGIES:  is allergic to lisinopril.  Meds: Current Outpatient Medications  Medication Sig Dispense Refill  . acetaminophen (TYLENOL) 500 MG tablet Hold this medication while you are taking the narcotic pain pain medication    . amLODipine (NORVASC) 10 MG tablet     . doxazosin (CARDURA) 2 MG tablet Take by mouth at bedtime.     Marland Kitchen ESOMEPRAZOLE MAGNESIUM PO Take by mouth. Nexium one tablet daily    . HYDROcodone-acetaminophen (NORCO) 5-325 MG tablet Take 1 tablet by mouth every 6 (six) hours as needed for moderate pain. 6 tablet 0  .  rosuvastatin (CRESTOR) 10 MG tablet at bedtime.     . tadalafil (CIALIS) 20 MG tablet Take by mouth.    . traZODone (DESYREL) 50 MG tablet Take 100 mg by mouth at bedtime as needed.     No current facility-administered medications for this encounter.    Physical Findings: In general this is a well appearing Caucasian male in no acute distress. He's alert and oriented x4 and appropriate throughout the examination. Cardiopulmonary assessment is negative for acute distress and he exhibits normal effort.   Lab Findings: Lab Results  Component Value Date   WBC 4.7 04/26/2019   HGB 14.4 04/26/2019   HCT 43.2 04/26/2019   MCV 95.4 04/26/2019   PLT 176 04/26/2019    Radiographic Findings:  Patient underwent CT imaging in our clinic for post implant dosimetry. The CT will be reviewed by Dr. Tammi Klippel to confirm there is an adequate distribution of radioactive seeds throughout the prostate gland and ensure that there are no seeds in or near the rectum. His scheduled for prostate MRI on 05/20/2019 and those images will be fused with his CT images for further evaluation. We suspect the final radiation plan and dosimetry will show appropriate coverage of the prostate gland. He understands that we will call and inform him of any unexpected findings on further review of his imaging and dosimetry.  Impression/Plan: 64 y.o. gentleman with Stage T1c adenocarcinoma of the prostate with Gleason score of 3+4, and PSA of 2.5. The patient is recovering from the effects of radiation. His urinary  symptoms should gradually improve over the next 4-6 months. We talked about this today. He is encouraged by his improvement already and is otherwise pleased with his outcome. We also talked about long-term follow-up for prostate cancer following seed implant. He understands that ongoing PSA determinations and digital rectal exams will help perform surveillance to rule out disease recurrence. He has a follow up appointment  scheduled with Azucena Fallen, NP on 05/20/2019 and anticipates a follow up visit with Dr. Jeffie Pollock in July or August for his first post-treatment PSA. He understands what to expect with his PSA measures. Patient was also educated today about some of the long-term effects from radiation including a small risk for rectal bleeding and possibly erectile dysfunction. We talked about some of the general management approaches to these potential complications. However, I did encourage the patient to contact our office or return at any point if he has questions or concerns related to his previous radiation and prostate cancer.  Today, a comprehensive survivorship care plan and treatment summary was reviewed with the patient today detailing his prostate cancer diagnosis, treatment course, potential late/long-term effects of treatment, appropriate follow-up care with recommendations for the future, and patient education resources.  A copy of this summary, along with a letter will be sent to the patient's primary care provider via mail/fax/In Basket message after today's visit.  2. Cancer screening:  Due to Mr. Kostoff history and his age, he should receive screening for skin cancers, colon cancer, and lung cancer.  The information and recommendations are listed on the patient's comprehensive care plan/treatment summary and were reviewed in detail with the patient.     3. Health maintenance and wellness promotion: Mr. Character was encouraged to consume 5-7 servings of fruits and vegetables per day. He was provided a copy of the "Nutrition Rainbow" handout, as well as the handout "Take Control of Your Health and [place]" from the [place].  He was also encouraged to engage in moderate to vigorous exercise for 30 minutes per day most days of the week. Information was provided regarding the [facility] fitness program, which is designed for cancer survivors to help them become more physically fit  after cancer treatments. We discussed that a healthy BMI is 18.5-24.9 and that maintaining a healthy weight reduces risk of cancer recurrences.  He was instructed to limit his alcohol consumption and continue to abstain from tobacco use.  Lastly, he was encouraged to use sunscreen and wear protective clothing when in the sun.     4. Support services/counseling: It is not uncommon for this period of the patient's cancer care trajectory to be one of many emotions and stressors.  Mr. Demus was encouraged to take advantage of our many support services programs, support groups, and/or counseling in coping with his new life as a cancer survivor after completing anti-cancer treatment.  He was offered support today through active listening and expressive supportive counseling.  He was given information regarding our available services and encouraged to contact me with any questions or for help enrolling in any of our support group/programs.       Nicholos Johns, PA-C

## 2019-05-19 NOTE — Progress Notes (Signed)
Patient in office for Post Seed visit denies any hematuria dysuria however has difficulty starting his stream denies any urgency frequency or leakage. Nocturia x2 most nights. Follow up appointment with urologist on 05/20/19

## 2019-05-19 NOTE — Addendum Note (Signed)
Encounter addended by: Freeman Caldron, PA-C on: 05/19/2019 9:25 PM  Actions taken: Clinical Note Signed, Level of Service modified

## 2019-05-20 ENCOUNTER — Other Ambulatory Visit: Payer: Self-pay | Admitting: Urology

## 2019-05-20 ENCOUNTER — Telehealth: Payer: Self-pay | Admitting: *Deleted

## 2019-05-20 ENCOUNTER — Ambulatory Visit (HOSPITAL_COMMUNITY)
Admission: RE | Admit: 2019-05-20 | Discharge: 2019-05-20 | Disposition: A | Discharge status: Home / Self Care | Payer: BC Managed Care – PPO | Source: Ambulatory Visit | Attending: Urology | Admitting: Urology

## 2019-05-20 ENCOUNTER — Encounter: Payer: Self-pay | Admitting: Medical Oncology

## 2019-05-20 ENCOUNTER — Ambulatory Visit: Payer: BC Managed Care – PPO | Admitting: Radiation Oncology

## 2019-05-20 ENCOUNTER — Ambulatory Visit: Payer: BC Managed Care – PPO | Admitting: Urology

## 2019-05-20 DIAGNOSIS — C61 Malignant neoplasm of prostate: Secondary | ICD-10-CM | POA: Insufficient documentation

## 2019-05-20 MED ORDER — LORAZEPAM 1 MG PO TABS: 1.0000 mg | ORAL_TABLET | ORAL | 0 refills | Status: DC | PRN

## 2019-05-20 NOTE — Telephone Encounter (Signed)
Called patient to inform that script is ready for pick-up @ his drugstore, spoke with patient and he is aware of this

## 2019-05-22 NOTE — Progress Notes (Signed)
  Radiation Oncology         [phone] ________________________________  Name: Randy Barrera MRN: [id number]  Date: 05/19/2019  DOB: 1955/01/23  COMPLEX SIMULATION NOTE  NARRATIVE:  The patient was brought to the [facility] today following prostate seed implantation approximately one month ago.  Identity was confirmed.  All relevant records and images related to the planned course of therapy were reviewed.  Then, the patient was set-up supine.  CT images were obtained.  The CT images were loaded into the planning software.  Then the prostate and rectum were contoured.  Treatment planning then occurred.  The implanted iodine 125 seeds were identified by the physics staff for projection of radiation distribution  I have requested : 3D Simulation  I have requested a DVH of the following structures: Prostate and rectum.    ________________________________  Sheral Apley Tammi Klippel, M.D.

## 2019-05-27 ENCOUNTER — Encounter: Payer: Self-pay | Admitting: Radiation Oncology

## 2019-05-27 DIAGNOSIS — C61 Malignant neoplasm of prostate: Secondary | ICD-10-CM | POA: Diagnosis not present

## 2019-06-15 NOTE — Progress Notes (Signed)
  Radiation Oncology         [phone] ________________________________  Name: Randy Barrera MRN: [id number]  Date: 05/27/2019  DOB: 1955-02-10  3D Planning Note   Prostate Brachytherapy Post-Implant Dosimetry  Diagnosis: 64 y.o. gentleman with Stage T1c adenocarcinoma of the prostate with Gleason score of 3+4, and PSA of 2.5   Narrative: On a previous date, Randy Barrera returned following prostate seed implantation for post implant planning. He underwent CT scan complex simulation to delineate the three-dimensional structures of the pelvis and demonstrate the radiation distribution.  Since that time, the seed localization, and complex isodose planning with dose volume histograms have now been completed.  Results:   Prostate Coverage - The dose of radiation delivered to the 90% or more of the prostate gland (D90) was 99.62% of the prescription dose. This exceeds our goal of greater than 90%. Rectal Sparing - The volume of rectal tissue receiving the prescription dose or higher was 0.0 cc. This falls under our thresholds tolerance of 1.0 cc.  Impression: The prostate seed implant appears to show adequate target coverage and appropriate rectal sparing.  Plan:  The patient will continue to follow with urology for ongoing PSA determinations. I would anticipate a high likelihood for local tumor control with minimal risk for rectal morbidity.  ________________________________  Sheral Apley Tammi Klippel, M.D.

## 2020-10-13 ENCOUNTER — Other Ambulatory Visit (HOSPITAL_COMMUNITY): Payer: Self-pay | Admitting: Urology

## 2020-10-13 ENCOUNTER — Other Ambulatory Visit: Payer: Self-pay | Admitting: Urology

## 2020-10-13 DIAGNOSIS — R911 Solitary pulmonary nodule: Secondary | ICD-10-CM

## 2020-10-16 ENCOUNTER — Telehealth: Payer: Self-pay | Admitting: Radiology

## 2020-11-02 ENCOUNTER — Encounter: Payer: Self-pay | Admitting: Urology

## 2020-11-02 ENCOUNTER — Ambulatory Visit: Payer: Medicare HMO | Admitting: Urology

## 2020-11-02 ENCOUNTER — Other Ambulatory Visit: Payer: Self-pay

## 2020-11-02 VITALS — BP 144/83 | HR 54 | Temp 98.6°F | Wt 189.8 lb

## 2020-11-02 DIAGNOSIS — N430 Encysted hydrocele: Secondary | ICD-10-CM | POA: Diagnosis not present

## 2020-11-02 DIAGNOSIS — R3129 Other microscopic hematuria: Secondary | ICD-10-CM | POA: Diagnosis not present

## 2020-11-02 DIAGNOSIS — N529 Male erectile dysfunction, unspecified: Secondary | ICD-10-CM | POA: Diagnosis not present

## 2020-11-02 DIAGNOSIS — C61 Malignant neoplasm of prostate: Secondary | ICD-10-CM

## 2020-11-02 LAB — URINALYSIS, ROUTINE W REFLEX MICROSCOPIC
Bilirubin, UA: NEGATIVE
Glucose, UA: NEGATIVE
Ketones, UA: NEGATIVE
Leukocytes,UA: NEGATIVE
Nitrite, UA: NEGATIVE
Protein,UA: NEGATIVE
Specific Gravity, UA: 1.02 (ref 1.005–1.030)
Urobilinogen, Ur: 0.2 mg/dL (ref 0.2–1.0)
pH, UA: 5.5 (ref 5.0–7.5)

## 2020-11-02 LAB — MICROSCOPIC EXAMINATION
Bacteria, UA: NONE SEEN
Renal Epithel, UA: NONE SEEN /hpf
WBC, UA: NONE SEEN /hpf (ref 0–5)

## 2020-11-02 NOTE — Progress Notes (Signed)
Subjective: 1. Prostate cancer ([place])   2. ED (erectile dysfunction) of organic origin   3. Microhematuria   4. Encysted hydrocele     11/02/20:  [name] returns today in f/u.  He completed antibiotic therapy for the possible epididymitis but didn't notice much benefit.  He has less pain but persistent swelling.  He has subsequently had intermittent gross hematuria with the last episode yesterday.  The hematuria is initial.  His UA today has 3-10 RBC's. He remains on doxazosin and his IPSS is 14.   [name] is a 65 year old gentleman who was initially diagnosed with very low risk prostate cancer in November 2019 with a PSA of 2.12 and 1 out of 12 biopsy cores with Gleason 3+3=6 adenocarcinoma on his TRUS biopsy from 11//20/19. He elected active surveillance management. An MRI of the prostate on 11/19/18 showed a 1.3 cm PI-RADS 3 lesion at the left base. He underwent an MR/US fusion biopsy on 01/27/18 that demonstrated Gleason 3+4=7 adenocarcinoma with 7 out of 16 biopsy cores positive including 3 out of 3 targeted ROI biopsies.  He had prostate brachytherapy on 04/29/19 and his most recent PSA on 09/20/20 was 1.7 which was down from 2.5 preop.  He has had hematuria and had a w/u in the [place] office with CT and cystoscopy on 10/04/20.  No obvious cause was identified.  He was started on bactrim on 10/04/20 for possible epididymitis.  He has a solitary right kidney  He was recent started on Doxazosin for LUTS and is on sildenafil for ED.    Family history: Brother and father. His father died in his mid 3s for metastatic disease. His brother was treated in his early 56s with a radical prostatectomy and is doing well.    Imaging studies:  MRI (01/28/19): No SVI, EPE, or LAD. No bone lesions.    PMH: He has a history of GERD, hypertension, hyperlipidemia, and history of gastric ulcer.  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    TNM stage: cT1c N0 Mx  PSA: 2.5  Gleason score: 3+4=7 (Grade group  2)  Biopsy (01/28/19): 7/16 cores positive  Left: L mid (5%, 3+3=6), L lateral base (<5%, 3+4=7), L base (40%, 3+4=7)  Right: R lateral apex (10%, 3+4=7)  MR target: 3/3 cores, 3+4=7 (60%, 60%, 20%)  Prostate volume: 42.0 cc  ROS:  ROS  Allergies  Allergen Reactions   Lisinopril Hives, Swelling and Rash    Past Medical History:  Diagnosis Date   Family history of prostate cancer    Foot fracture 2015   Hypertension    Prostate cancer [facility])     Past Surgical History:  Procedure Laterality Date   FOOT FRACTURE SURGERY Right 2015   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   PROSTATE BIOPSY  01/2019   RADIOACTIVE SEED IMPLANT N/A 04/29/2019   Procedure: RADIOACTIVE SEED IMPLANT/BRACHYTHERAPY IMPLANT;  Surgeon: Irine Seal, MD;  Location: [place];  Service: Urology;  Laterality: N/A;   SPACE OAR INSTILLATION N/A 04/29/2019   Procedure: SPACE OAR INSTILLATION;  Surgeon: Irine Seal, MD;  Location: [facility];  Service: Urology;  Laterality: N/A;    Social History   Socioeconomic History   Marital status: Married    Spouse name: Randy Barrera   Number of children: 1   Years of education: 16   Highest education level: Not on file  Occupational History    Comment: [employer]  Tobacco Use   Smoking status: Former    Packs/day:  2.00    Years: 20.00    Pack years: 40.00    Types: Cigarettes    Quit date: 10/05/1994    Years since quitting: 26.0   Smokeless tobacco: Never  Vaping Use   Vaping Use: Never used  Substance and Sexual Activity   Alcohol use: Yes    Alcohol/week: 2.0 standard drinks    Types: 2 Cans of beer per week    Comment: 2 burbons per day 4 oz day   Drug use: No   Sexual activity: Not Currently  Other Topics Concern   Not on file  Social History Narrative   Lives at home with wife    Caffeine use- coffee- 2 daily   Social Determinants of Health   Financial Resource Strain: Not on file  Food Insecurity: Not on file   Transportation Needs: Not on file  Physical Activity: Not on file  Stress: Not on file  Social Connections: Not on file  Intimate Partner Violence: Not on file    Family History  Problem Relation Age of Onset   Prostate cancer Father    Prostate cancer Brother    Cancer Paternal Grandmother    Breast cancer Neg Hx    Colon cancer Neg Hx    Pancreatic cancer Neg Hx     Anti-infectives: Anti-infectives (From admission, onward)    None       Current Outpatient Medications  Medication Sig Dispense Refill   acetaminophen (TYLENOL) 500 MG tablet Hold this medication while you are taking the narcotic pain pain medication     amLODipine (NORVASC) 10 MG tablet      doxazosin (CARDURA) 4 MG tablet Take 4 mg by mouth daily.     ESOMEPRAZOLE MAGNESIUM PO Take by mouth. Nexium one tablet daily     rosuvastatin (CRESTOR) 10 MG tablet at bedtime.      tadalafil (CIALIS) 20 MG tablet Take by mouth.     traZODone (DESYREL) 50 MG tablet Take 100 mg by mouth at bedtime as needed.     No current facility-administered medications for this visit.     Objective: Vital signs in last 24 hours: BP (!) 144/83   Pulse (!) 54   Temp 98.6 F (37 C)   Wt 189 lb 12.8 oz (86.1 kg)   BMI 25.74 kg/m   Intake/Output from previous day: No intake/output data recorded. Intake/Output this shift: @IOTHISSHIFT @   Physical Exam Vitals reviewed.  Constitutional:      Appearance: Normal appearance.  Genitourinary:    Comments: Nl phallus. Scrotum with mild left swelling and a soft hydrocele.   Testes normal without mass. Epididymis normal without tenderness.  Neurological:     Mental Status: He is alert.    Lab Results:  Results for orders placed or performed in visit on 11/02/20 (from the past 24 hour(s))  Urinalysis, Routine w reflex microscopic     Status: Abnormal   Collection Time: 11/02/20  4:19 PM  Result Value Ref Range   Specific Gravity, UA 1.020 1.005 - 1.030   pH, UA 5.5  5.0 - 7.5   Color, UA Yellow Yellow   Appearance Ur Clear Clear   Leukocytes,UA Negative Negative   Protein,UA Negative Negative/Trace   Glucose, UA Negative Negative   Ketones, UA Negative Negative   RBC, UA 2+ (A) Negative   Bilirubin, UA Negative Negative   Urobilinogen, Ur 0.2 0.2 - 1.0 mg/dL   Nitrite, UA Negative Negative   Microscopic Examination See below:  Narrative   Performed at:  [facility] [address] [place], [place]  [id number] Lab Director: Mina Marble [place], Phone:  [phone]  Microscopic Examination     Status: Abnormal   Collection Time: 11/02/20  4:19 PM   Urine  Result Value Ref Range   WBC, UA None seen 0 - 5 /hpf   RBC 3-10 (A) 0 - 2 /hpf   Epithelial Cells (non renal) 0-10 0 - 10 /hpf   Renal Epithel, UA None seen None seen /hpf   Mucus, UA Present Not Estab.   Bacteria, UA None seen None seen/Few   Narrative   Performed at:  [facility] [address], [place], [place]  [id number] Lab Director: [place], Phone:  [phone]    BMET No results for input(s): NA, K, CL, CO2, GLUCOSE, BUN, CREATININE, CALCIUM in the last 72 hours. PT/INR No results for input(s): LABPROT, INR in the last 72 hours. ABG No results for input(s): PHART, HCO3 in the last 72 hours.  Invalid input(s): PCO2, PO2  Studies/Results: No results found.   Assessment/Plan: Gross hematuria and hemospermia.  He has intermittent initial hematuria with a recent negative w/u.  This is most consistent with radiation related neovascularity.  I reassured him but if it persists we could consider re-cystoscopy when he is actively bleeding.  Prostate cancer.  F/u in 3 months with a PSA.  Left scrotal swelling.  I will get a scrotal US to further clarify.     No orders of the defined types were placed in this encounter.    Orders Placed This Encounter  Procedures   Microscopic Examination   US SCROTUM W/DOPPLER    Standing Status:    Future    Standing Expiration Date:   11/02/2021    Order Specific Question:   Reason for Exam (SYMPTOM  OR DIAGNOSIS REQUIRED)    Answer:   scrotal swelling    Order Specific Question:   Preferred imaging location?    Answer:   [facility]   PSA    Standing Status:   Future    Standing Expiration Date:   05/03/2021   Urinalysis, Routine w reflex microscopic     Return in about 3 months (around 02/02/2021) for with PSA.    CC: Dr. Myriam Jacobson.     Irine Seal 11/02/2020 [phone]

## 2020-11-02 NOTE — Progress Notes (Signed)
Urological Symptom Review  Patient is experiencing the following symptoms: Get up at night to urinate Stream starts and stops Blood in urine Weak stream   Review of Systems  Gastrointestinal (upper)  : Indigestion/heartburn  Gastrointestinal (lower) : Negative for lower GI symptoms  Constitutional : Negative for symptoms  Skin: Negative for skin symptoms  Eyes: Negative for eye symptoms  Ear/Nose/Throat : Negative for Ear/Nose/Throat symptoms  Hematologic/Lymphatic: Negative for Hematologic/Lymphatic symptoms  Cardiovascular : Negative for cardiovascular symptoms  Respiratory : Negative for respiratory symptoms  Endocrine: Negative for endocrine symptoms  Musculoskeletal: Negative for musculoskeletal symptoms  Neurological: Negative for neurological symptoms  Psychologic: Negative for psychiatric symptoms

## 2020-11-09 ENCOUNTER — Ambulatory Visit (HOSPITAL_COMMUNITY)
Admission: RE | Admit: 2020-11-09 | Discharge: 2020-11-09 | Disposition: A | Discharge status: Home / Self Care | Payer: Medicare HMO | Source: Ambulatory Visit | Attending: Urology | Admitting: Urology

## 2020-11-09 ENCOUNTER — Other Ambulatory Visit: Payer: Self-pay

## 2020-11-09 DIAGNOSIS — N430 Encysted hydrocele: Secondary | ICD-10-CM

## 2020-11-10 NOTE — Progress Notes (Signed)
Results sent via my chart 

## 2020-12-12 ENCOUNTER — Encounter: Payer: Self-pay | Admitting: *Deleted

## 2020-12-20 ENCOUNTER — Ambulatory Visit (HOSPITAL_COMMUNITY): Admission: RE | Admit: 2020-12-20 | Payer: Medicare HMO | Source: Ambulatory Visit

## 2020-12-20 ENCOUNTER — Encounter (HOSPITAL_COMMUNITY): Payer: Self-pay

## 2021-01-04 ENCOUNTER — Other Ambulatory Visit: Payer: Self-pay

## 2021-01-04 ENCOUNTER — Ambulatory Visit: Payer: Medicare HMO | Admitting: Urology

## 2021-01-04 ENCOUNTER — Encounter: Payer: Self-pay | Admitting: Urology

## 2021-01-04 VITALS — BP 134/79 | HR 64

## 2021-01-04 DIAGNOSIS — N430 Encysted hydrocele: Secondary | ICD-10-CM

## 2021-01-04 DIAGNOSIS — C61 Malignant neoplasm of prostate: Secondary | ICD-10-CM | POA: Diagnosis not present

## 2021-01-04 DIAGNOSIS — N4342 Spermatocele of epididymis, multiple: Secondary | ICD-10-CM

## 2021-01-04 DIAGNOSIS — R310 Gross hematuria: Secondary | ICD-10-CM

## 2021-01-04 NOTE — Progress Notes (Signed)
Urological Symptom Review  Patient is experiencing the following symptoms: Frequent urination Get up at night to urinate Stream starts and stops   Review of Systems  Gastrointestinal (upper)  : Indigestion/heartburn  Gastrointestinal (lower) : Negative for lower GI symptoms  Constitutional : Negative for symptoms  Skin: Negative for skin symptoms  Eyes: Negative for eye symptoms  Ear/Nose/Throat : Negative for Ear/Nose/Throat symptoms  Hematologic/Lymphatic: Negative for Hematologic/Lymphatic symptoms  Cardiovascular : Negative for cardiovascular symptoms  Respiratory : Negative for respiratory symptoms  Endocrine: Negative for endocrine symptoms  Musculoskeletal: Negative for musculoskeletal symptoms  Neurological: Negative for neurological symptoms  Psychologic: Negative for psychiatric symptoms

## 2021-01-04 NOTE — Progress Notes (Signed)
Subjective: 1. Prostate cancer ([place])   2. Encysted hydrocele   3. Spermatocele of epididymis, multiple   4. Gross hematuria     01/04/21: Randy Barrera returns today in f/u.   He had some hematuria with clots at the end of November but that has resolved.  He is voiding ok but he has some nocturia 2x.  His IPSS is 11.  He has bilateral small hydroceles, right > left, bilateral spermatoceles on recent US but he has no pain.   He remains on doxazosin.   11/02/20:  Randy Barrera returns today in f/u.  He completed antibiotic therapy for the possible epididymitis but didn't notice much benefit.  He has less pain but persistent swelling.  He has subsequently had intermittent gross hematuria with the last episode yesterday.  The hematuria is initial.  His UA today has 3-10 RBC's. He remains on doxazosin and his IPSS is 14.   Randy Barrera is a 65 year old gentleman who was initially diagnosed with very low risk prostate cancer in November 2019 with a PSA of 2.12 and 1 out of 12 biopsy cores with Gleason 3+3=6 adenocarcinoma on his TRUS biopsy from 11//20/19. He elected active surveillance management. An MRI of the prostate on 11/19/18 showed a 1.3 cm PI-RADS 3 lesion at the left base. He underwent an MR/US fusion biopsy on 01/27/18 that demonstrated Gleason 3+4=7 adenocarcinoma with 7 out of 16 biopsy cores positive including 3 out of 3 targeted ROI biopsies.  He had prostate brachytherapy on 04/29/19 and his most recent PSA on 09/20/20 was 1.7 which was down from 2.5 preop.  He has had hematuria and had a w/u in the [place] office with CT and cystoscopy on 10/04/20.  No obvious cause was identified.  He was started on bactrim on 10/04/20 for possible epididymitis.  He has a solitary right kidney  He was recent started on Doxazosin for LUTS and is on sildenafil for ED.    Family history: Brother and father. His father died in his mid 40s for metastatic disease. His brother was treated in his early 43s with a radical prostatectomy and  is doing well.    Imaging studies:  MRI (01/28/19): No SVI, EPE, or LAD. No bone lesions.    PMH: He has a history of GERD, hypertension, hyperlipidemia, and history of gastric ulcer.  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    TNM stage: cT1c N0 Mx  PSA: 2.5  Gleason score: 3+4=7 (Grade group 2)  Biopsy (01/28/19): 7/16 cores positive  Left: L mid (5%, 3+3=6), L lateral base (<5%, 3+4=7), L base (40%, 3+4=7)  Right: R lateral apex (10%, 3+4=7)  MR target: 3/3 cores, 3+4=7 (60%, 60%, 20%)  Prostate volume: 42.0 cc  ROS:  ROS  Allergies  Allergen Reactions   Lisinopril Hives, Swelling and Rash    Past Medical History:  Diagnosis Date   Family history of prostate cancer    Foot fracture 2015   Hypertension    Prostate cancer [facility])     Past Surgical History:  Procedure Laterality Date   FOOT FRACTURE SURGERY Right 2015   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   PROSTATE BIOPSY  01/2019   RADIOACTIVE SEED IMPLANT N/A 04/29/2019   Procedure: RADIOACTIVE SEED IMPLANT/BRACHYTHERAPY IMPLANT;  Surgeon: Irine Seal, MD;  Location: [facility];  Service: Urology;  Laterality: N/A;   SPACE OAR INSTILLATION N/A 04/29/2019   Procedure: SPACE OAR INSTILLATION;  Surgeon: Irine Seal, MD;  Location: [facility];  Service: Urology;  Laterality: N/A;    Social History   Socioeconomic History   Marital status: Married    Spouse name: Pamala Hurry   Number of children: 1   Years of education: 16   Highest education level: Not on file  Occupational History    Comment: [employer]  Tobacco Use   Smoking status: Former    Packs/day: 2.00    Years: 20.00    Pack years: 40.00    Types: Cigarettes    Quit date: 10/05/1994    Years since quitting: 26.2   Smokeless tobacco: Never  Vaping Use   Vaping Use: Never used  Substance and Sexual Activity   Alcohol use: Yes    Alcohol/week: 2.0 standard drinks    Types: 2 Cans of beer per week    Comment: 2 burbons  per day 4 oz day   Drug use: No   Sexual activity: Not Currently  Other Topics Concern   Not on file  Social History Narrative   Lives at home with wife    Caffeine use- coffee- 2 daily   Social Determinants of Health   Financial Resource Strain: Not on file  Food Insecurity: Not on file  Transportation Needs: Not on file  Physical Activity: Not on file  Stress: Not on file  Social Connections: Not on file  Intimate Partner Violence: Not on file    Family History  Problem Relation Age of Onset   Prostate cancer Father    Prostate cancer Brother    Cancer Paternal Grandmother    Breast cancer Neg Hx    Colon cancer Neg Hx    Pancreatic cancer Neg Hx     Anti-infectives: Anti-infectives (From admission, onward)    None       Current Outpatient Medications  Medication Sig Dispense Refill   acetaminophen (TYLENOL) 500 MG tablet Hold this medication while you are taking the narcotic pain pain medication     amLODipine (NORVASC) 10 MG tablet      doxazosin (CARDURA) 4 MG tablet 1 tablet     esomeprazole (NEXIUM) 20 MG capsule See admin instructions.     ESOMEPRAZOLE MAGNESIUM PO Take by mouth. Nexium one tablet daily     rosuvastatin (CRESTOR) 10 MG tablet at bedtime.      traZODone (DESYREL) 50 MG tablet Take 100 mg by mouth at bedtime as needed.     No current facility-administered medications for this visit.     Objective: Vital signs in last 24 hours: BP 134/79    Pulse 64   Intake/Output from previous day: No intake/output data recorded. Intake/Output this shift: @IOTHISSHIFT @   Physical Exam  Lab Results:  No results found for this or any previous visit (from the past 24 hour(s)).   BMET No results for input(s): NA, K, CL, CO2, GLUCOSE, BUN, CREATININE, CALCIUM in the last 72 hours. PT/INR No results for input(s): LABPROT, INR in the last 72 hours. ABG No results for input(s): PHART, HCO3 in the last 72 hours.  Invalid input(s): PCO2,  PO2  Studies/Results: No results found.   Assessment/Plan: Gross hematuria and hemospermia.  The hematuria resolved earlier this month.  This is most consistent with radiation related neovascularity.    Prostate cancer.  F/u in 3 months with a PSA.  Left scrotal swelling.  Scrotal US showed bilateral spermatoceles and hydroceles, left > right.   He doesn't need intervention at this time.    No orders of the defined types were placed  in this encounter.     Orders Placed This Encounter  Procedures   Urinalysis, Routine w reflex microscopic   PSA    Can be drawn at [facility] in [place] as he would like to resume f/u there now that he has changed to [facility].    Standing Status:   Future    Standing Expiration Date:   07/05/2021     Return in about 3 months (around 04/04/2021) for With PSA at [facility] in [place]. .    CC: Dr. Myriam Jacobson.     Irine Seal 01/04/2021 [phone] Patient ID: Randy Barrera, male   DOB: 1955/10/19, 65 y.o.   MRN: [id number]

## 2021-01-05 LAB — URINALYSIS, ROUTINE W REFLEX MICROSCOPIC
Bilirubin, UA: NEGATIVE
Glucose, UA: NEGATIVE
Ketones, UA: NEGATIVE
Leukocytes,UA: NEGATIVE
Nitrite, UA: NEGATIVE
Protein,UA: NEGATIVE
Specific Gravity, UA: 1.025 (ref 1.005–1.030)
Urobilinogen, Ur: 0.2 mg/dL (ref 0.2–1.0)
pH, UA: 5.5 (ref 5.0–7.5)

## 2021-01-05 LAB — MICROSCOPIC EXAMINATION
Bacteria, UA: NONE SEEN
Epithelial Cells (non renal): NONE SEEN /hpf (ref 0–10)
Renal Epithel, UA: NONE SEEN /hpf
WBC, UA: NONE SEEN /hpf (ref 0–5)

## 2021-01-29 ENCOUNTER — Encounter: Payer: Medicare HMO | Admitting: Student in an Organized Health Care Education/Training Program

## 2021-01-30 DIAGNOSIS — I10 Essential (primary) hypertension: Secondary | ICD-10-CM | POA: Diagnosis not present

## 2021-01-30 DIAGNOSIS — E782 Mixed hyperlipidemia: Secondary | ICD-10-CM | POA: Diagnosis not present

## 2021-02-05 ENCOUNTER — Encounter: Payer: Medicare HMO | Admitting: Student in an Organized Health Care Education/Training Program

## 2021-02-06 ENCOUNTER — Encounter: Payer: Self-pay | Admitting: Neurology

## 2021-02-06 DIAGNOSIS — M79671 Pain in right foot: Secondary | ICD-10-CM | POA: Diagnosis not present

## 2021-02-06 DIAGNOSIS — M19049 Primary osteoarthritis, unspecified hand: Secondary | ICD-10-CM | POA: Diagnosis not present

## 2021-02-06 DIAGNOSIS — Z720 Tobacco use: Secondary | ICD-10-CM | POA: Diagnosis not present

## 2021-02-06 DIAGNOSIS — E782 Mixed hyperlipidemia: Secondary | ICD-10-CM | POA: Diagnosis not present

## 2021-02-06 DIAGNOSIS — G4700 Insomnia, unspecified: Secondary | ICD-10-CM | POA: Diagnosis not present

## 2021-02-06 DIAGNOSIS — B351 Tinea unguium: Secondary | ICD-10-CM | POA: Diagnosis not present

## 2021-02-06 DIAGNOSIS — Z23 Encounter for immunization: Secondary | ICD-10-CM | POA: Diagnosis not present

## 2021-02-06 DIAGNOSIS — I10 Essential (primary) hypertension: Secondary | ICD-10-CM | POA: Diagnosis not present

## 2021-02-06 DIAGNOSIS — Z0000 Encounter for general adult medical examination without abnormal findings: Secondary | ICD-10-CM | POA: Diagnosis not present

## 2021-02-06 DIAGNOSIS — R269 Unspecified abnormalities of gait and mobility: Secondary | ICD-10-CM | POA: Diagnosis not present

## 2021-02-06 DIAGNOSIS — Z524 Kidney donor: Secondary | ICD-10-CM | POA: Diagnosis not present

## 2021-02-13 ENCOUNTER — Telehealth: Payer: Self-pay | Admitting: Podiatrist

## 2021-02-13 ENCOUNTER — Encounter: Payer: Self-pay | Admitting: Urology

## 2021-02-13 ENCOUNTER — Encounter: Payer: Self-pay | Admitting: Podiatrist

## 2021-02-13 ENCOUNTER — Ambulatory Visit: Payer: Medicare Other | Admitting: Podiatrist

## 2021-02-13 ENCOUNTER — Other Ambulatory Visit: Payer: Self-pay

## 2021-02-13 ENCOUNTER — Ambulatory Visit (INDEPENDENT_AMBULATORY_CARE_PROVIDER_SITE_OTHER): Payer: Medicare Other

## 2021-02-13 DIAGNOSIS — M19079 Primary osteoarthritis, unspecified ankle and foot: Secondary | ICD-10-CM | POA: Diagnosis not present

## 2021-02-13 DIAGNOSIS — Z720 Tobacco use: Secondary | ICD-10-CM | POA: Insufficient documentation

## 2021-02-13 DIAGNOSIS — Z79899 Other long term (current) drug therapy: Secondary | ICD-10-CM | POA: Insufficient documentation

## 2021-02-13 DIAGNOSIS — M778 Other enthesopathies, not elsewhere classified: Secondary | ICD-10-CM

## 2021-02-13 DIAGNOSIS — R1013 Epigastric pain: Secondary | ICD-10-CM | POA: Insufficient documentation

## 2021-02-13 DIAGNOSIS — Q667 Congenital pes cavus, unspecified foot: Secondary | ICD-10-CM

## 2021-02-13 DIAGNOSIS — M7742 Metatarsalgia, left foot: Secondary | ICD-10-CM | POA: Diagnosis not present

## 2021-02-13 DIAGNOSIS — G479 Sleep disorder, unspecified: Secondary | ICD-10-CM | POA: Insufficient documentation

## 2021-02-13 DIAGNOSIS — G4700 Insomnia, unspecified: Secondary | ICD-10-CM | POA: Insufficient documentation

## 2021-02-13 DIAGNOSIS — N1831 Chronic kidney disease, stage 3a: Secondary | ICD-10-CM | POA: Insufficient documentation

## 2021-02-13 DIAGNOSIS — M7741 Metatarsalgia, right foot: Secondary | ICD-10-CM

## 2021-02-13 DIAGNOSIS — L570 Actinic keratosis: Secondary | ICD-10-CM | POA: Insufficient documentation

## 2021-02-13 DIAGNOSIS — E782 Mixed hyperlipidemia: Secondary | ICD-10-CM | POA: Insufficient documentation

## 2021-02-13 DIAGNOSIS — R269 Unspecified abnormalities of gait and mobility: Secondary | ICD-10-CM | POA: Insufficient documentation

## 2021-02-13 DIAGNOSIS — K219 Gastro-esophageal reflux disease without esophagitis: Secondary | ICD-10-CM | POA: Insufficient documentation

## 2021-02-13 DIAGNOSIS — M19049 Primary osteoarthritis, unspecified hand: Secondary | ICD-10-CM | POA: Insufficient documentation

## 2021-02-13 NOTE — Patient Instructions (Signed)
High Arch Feet A high arch foot is a condition in which the arch of the foot does not touch the ground when a person is standing or walking. This condition is called high arch feet when it happens in both feet. In high arch feet, more weight is put on the parts of the feet that touch the ground, including the fronts, outer sides, and heels of the feet. This condition is also known as cavus foot. Having high arches can be painful and can make a person physically unstable. What are the causes? This condition may be caused by: Being born with a high arch in one foot or both feet. Being born with a foot that turns inward (club foot). Having a broken foot that does not heal properly. Sometimes, the cause of high arch feet is not known. What increases the risk? The following factors may make you more likely to develop this condition: Being male. Being an adult younger than 67 years of age. Having a family history of high arch feet. Having Charcot-Marie-Tooth disease. Having a nervous system (neurological) condition such as stroke, polio, spina bifida, cerebral palsy, or muscular dystrophy. In these conditions, high arches tend to get worse over time. What are the signs or symptoms? This condition can range from mild to severe. People with mildly high arches may have few symptoms. More severely high arches may cause: An unstable ankle with frequent ankle sprains or strains. A sharp or shooting pain in the ball of your foot. Calluses on the front, side, and heel of the foot. Toes that are unusual in shape (toe deformities). Dragging of the foot (foot drop). This can happen if the high arch is caused by a neurological condition, which can cause the muscles that lift the foot to become weak. How is this diagnosed? This condition is diagnosed with a physical exam of your foot and ankle. Your health care provider may also: Ask about your medical and family history. Order imaging tests, such as X-rays  to look at the bone structure. Refer you to a health care provider who specializes in feet (podiatrist). If your health care provider thinks that you have a neurological condition, you may need to see a health care provider who specializes in the nervous system (neurologist). How is this treated? This condition may be treated to prevent pain and stabilize the foot. First treatments may include: A shoe insert (orthotic insert) to support and cushion one foot or both feet. Supportive shoes with high ankle support and a wide base. A brace to support a weak or unstable ankle. Physical therapy exercises to improve movement and strength and to loosen tight muscles in your feet. If these treatments do not help, you may need surgery. This is more likely when high arches are caused by a neurological condition that is getting worse. Follow these instructions at home: If you have a brace: Wear the brace as told by your health care provider. Remove it only as told by your health care provider. Loosen the brace if your toes tingle, become numb, or turn cold and blue. Keep the brace clean. Do not let the brace get wet if it is not waterproof. Ask your health care provider when it is safe to drive if you have a brace on your foot. Activity Do exercises as told by your health care provider, if they were prescribed. Return to your normal activities as told by your health care provider. Ask your health care provider what activities are safe for  you. General instructions Take over-the-counter and prescription medicines only as told by your health care provider. Use an orthotic insert or inserts or wear supportive shoes as told by your health care provider. Keep all follow-up visits. This is important. Contact a health care provider if: You continue to have pain when walking or standing. Your foot or ankle feels weak and unstable. You sprain your ankle. Summary A high arch foot is a condition in which the  arch of the foot does not touch the ground when you are standing or walking. This condition is called high arch feet when it happens in both feet. A high arch foot can be painful and make you physically unstable. This condition is diagnosed with a physical exam and other tests. Treatment may include shoe inserts, supportive shoes, bracing, physical therapy, or surgery. This information is not intended to replace advice given to you by your health care provider. Make sure you discuss any questions you have with your health care provider. Document Revised: 06/10/2019 Document Reviewed: 06/10/2019 Elsevier Patient Education  2022 [employer].

## 2021-02-13 NOTE — Telephone Encounter (Signed)
Pt called stating he seen Dr Valentina Lucks this morning and talked with Aaron Edelman about orthotics. HE said Aaron Edelman told him they would probably not be covered but he called the insurance company and spoke to 2 different people and was told they are covered.   I explained that they say that but they do not mention them having to meet a specific criteria and the ones we order do not.  I explained that we just had this happen to someone. I asked if he was given the cpt code and he was not. I asked if he would like them and call the insurance company back with the codes and ask if they are covered with the cpt and dx codes that I gave him. He said that would be good and I gave him the L3020 cpt and dx codes in his chart. He was very Patent attorney.

## 2021-02-13 NOTE — Progress Notes (Signed)
Chief Complaint  Patient presents with   Foot Pain    Plantar forefoot bilateral (R>L) - aching, some cramping, intermittent x 2 years, no injury, no treatment   New Patient (Initial Visit)     HPI: Patient is 66 y.o. male who presents today for generalized forefoot pain for about 2 years duration.  He states he has some aching and some cramping with intermittent pain right greater than left.  He is active he plays a lot of golf and walks a lot.  He is tried no self treatment.  He is unable to take anti-inflammatories due to having 1 kidney after being a kidney donor.  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   Pain 05/05/2019   Hypertension 03/03/2019   Genetic testing 02/24/2018   Family history of prostate cancer    Prostate cancer [facility])    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      terbinafine (LAMISIL) 250 MG tablet Take 250 mg by mouth daily.     traZODone (DESYREL) 50 MG tablet  Take 100 mg by mouth at bedtime as needed.     No current facility-administered medications on file prior to visit.    Allergies  Allergen Reactions   Lisinopril Hives, Swelling and Rash    Review of Systems No fevers, chills, nausea, muscle aches, no difficulty breathing, no calf pain, no chest pain or shortness of breath.   Physical Exam  GENERAL APPEARANCE: Alert, conversant. Appropriately groomed. No acute distress.   VASCULAR: Pedal pulses palpable DP and PT bilateral.  Capillary refill time is immediate to all digits,  Proximal to distal cooling it warm to warm.  Digital perfusion adequate.   NEUROLOGIC: sensation is intact to 5.07 monofilament at 5/5 sites bilateral.  Light touch is intact bilateral, vibratory sensation intact bilateral  MUSCULOSKELETAL: Cavus foot type is noted with high arched foot and prominent forefoot bilateral.  Acceptable muscle strength, tone and stability bilateral.  Mild decrease in range of motion at the first metatarsal phalangeal joint of the right is noted with crepitus noted with range of motion right greater than left.    DERMATOLOGIC: skin is warm, supple, and dry.  No open lesions noted.  No rash, no pre ulcerative lesions. Digital nails are asymptomatic.    X-ray evaluation 3 views of bilateral feet are obtained.  Minimal sign of arthritic changes with decrease in joint space noted at the right first metatarsal phalangeal joint compared to the left.  High arch foot type is noted with increased calcaneal inclination angle and increased first metatarsal declination angle consistent with cavus foot deformity.  Assessment     ICD-10-CM   1. Arthritis of metatarsophalangeal (MTP) joint of great toe  M19.079 DG Foot Complete Right    DG Foot Complete Left    2. Metatarsalgia of both feet  M77.41    M77.42     3. High foot arch  Q66.70     4. Capsulitis of foot, unspecified laterality  M77.8        Plan  Discussed treatment options  x-ray findings and recommendations with the patient.  I recommended orthotics to support the arch and offload the forefoot and first metatarsal bilateral.  He did speak with Aaron Edelman our orthotic specialist at today's visit.  For now he will hold off on the orthotics however if in the future he changes his mind he will call to be seen by Aaron Edelman.  I recommended good supportive shoe gear in the meantime.  In the future if any questions or concerns arise he will call otherwise he will be seen back as needed for follow-up.

## 2021-02-14 ENCOUNTER — Other Ambulatory Visit: Payer: Self-pay | Admitting: Urology

## 2021-02-14 ENCOUNTER — Telehealth: Payer: Self-pay | Admitting: Podiatrist

## 2021-02-14 DIAGNOSIS — R918 Other nonspecific abnormal finding of lung field: Secondary | ICD-10-CM

## 2021-02-14 NOTE — Telephone Encounter (Signed)
Per Randy Barrera @ [facility] [employer] [facility] [phone]) is not covered by pts plan.. reference # [id number].Marland KitchenMarland Kitchen

## 2021-02-14 NOTE — Telephone Encounter (Signed)
I looked back over this patient chart in Epic. We started seeing him here in October. Looks like chest CT was ordered when you saw him at [facility] Not sure how you want to proceed with patient request.

## 2021-02-19 ENCOUNTER — Telehealth: Payer: Self-pay

## 2021-02-19 ENCOUNTER — Other Ambulatory Visit: Payer: Self-pay

## 2021-02-19 ENCOUNTER — Ambulatory Visit (INDEPENDENT_AMBULATORY_CARE_PROVIDER_SITE_OTHER): Payer: Medicare Other

## 2021-02-19 DIAGNOSIS — M778 Other enthesopathies, not elsewhere classified: Secondary | ICD-10-CM

## 2021-02-19 DIAGNOSIS — M19072 Primary osteoarthritis, left ankle and foot: Secondary | ICD-10-CM

## 2021-02-19 DIAGNOSIS — M19079 Primary osteoarthritis, unspecified ankle and foot: Secondary | ICD-10-CM

## 2021-02-19 DIAGNOSIS — M7741 Metatarsalgia, right foot: Secondary | ICD-10-CM

## 2021-02-19 DIAGNOSIS — M19071 Primary osteoarthritis, right ankle and foot: Secondary | ICD-10-CM | POA: Diagnosis not present

## 2021-02-19 DIAGNOSIS — Q667 Congenital pes cavus, unspecified foot: Secondary | ICD-10-CM

## 2021-02-19 NOTE — Telephone Encounter (Addendum)
    Spoke with Jess at [facility] to do a predetermination for orthotics [phone]). The pt stated that the insurance company told him that it would be covered so a predetermination/auth has been opened for approval. The case/ticket number is [id number] and the call reference number is [id number] and I should be hearing from [facility] in 72 hours.

## 2021-02-19 NOTE — Progress Notes (Signed)
SITUATION Reason for Consult: Evaluation for Bilateral Custom Foot Orthoses Patient / Caregiver Report: Patient is ready for foot orthotics  OBJECTIVE DATA: Patient History / Diagnosis:    ICD-10-CM   1. Arthritis of metatarsophalangeal (MTP) joint of great toe  M19.079     2. Metatarsalgia of both feet  M77.41    M77.42     3. Capsulitis of foot, unspecified laterality  M77.8     4. High foot arch  Q66.70       Current or Previous Devices: None and no history  Foot Examination: Skin presentation:   Intact Ulcers & Callousing:   None and no history Toe / Foot Deformities:  Pes cavus Weight Bearing Presentation:  Cavus Sensation:    Intact  ORTHOTIC RECOMMENDATION Recommended Device: 1x pair of custom functional foot orthotics  GOALS OF ORTHOSES - Reduce Pain - Prevent Foot Deformity - Prevent Progression of Further Foot Deformity - Relieve Pressure - Improve the Overall Biomechanical Function of the Foot and Lower Extremity.  ACTIONS PERFORMED Patient was casted for Foot Orthoses via crush box. Procedure was explained and patient tolerated procedure well. All questions were answered and concerns addressed.  PLAN Potential out of pocket cost was communicated to patient. Casts are to be sent to [facility] for fabrication. Patient is to be called for fitting when devices are ready.

## 2021-02-20 ENCOUNTER — Ambulatory Visit (HOSPITAL_COMMUNITY)
Admission: RE | Admit: 2021-02-20 | Discharge: 2021-02-20 | Disposition: A | Discharge status: Home / Self Care | Payer: Medicare Other | Source: Ambulatory Visit | Attending: Urology | Admitting: Urology

## 2021-02-20 ENCOUNTER — Encounter (HOSPITAL_COMMUNITY): Payer: Self-pay

## 2021-02-20 DIAGNOSIS — I700 Atherosclerosis of aorta: Secondary | ICD-10-CM | POA: Diagnosis not present

## 2021-02-20 DIAGNOSIS — R918 Other nonspecific abnormal finding of lung field: Secondary | ICD-10-CM | POA: Diagnosis not present

## 2021-02-20 DIAGNOSIS — R911 Solitary pulmonary nodule: Secondary | ICD-10-CM | POA: Diagnosis not present

## 2021-03-22 ENCOUNTER — Telehealth: Payer: Self-pay

## 2021-03-22 NOTE — Telephone Encounter (Signed)
Casts Sent to [facility]  ?

## 2021-04-03 ENCOUNTER — Telehealth: Payer: Self-pay | Admitting: Acute Care

## 2021-04-03 NOTE — Telephone Encounter (Signed)
Received referral to lung cancer screening.  Unable to reach patient by phone. Left voicemails x 3.  According to smoking history in Epic, patient has not smoked in greater than 15 years which would make him ineligible for lung cancer screening criteria.  Patient had CT chest wo contrast in Feb 2023, which would be the alternate route for this patient, if provider recommends further scanning due to smoking history.  Referral for LCS LDCT will be closed.  Will fax referring provider update (to Lona Kettle, MD) at [phone] ?

## 2021-04-18 NOTE — Telephone Encounter (Signed)
Patient has not returned messages but noted urologist ordered CT scan as per patient request.  Will close this referral as patient does not appear to qualify for LCS since smoking history still documented as quit smoking 1996. ?

## 2021-04-23 ENCOUNTER — Ambulatory Visit: Payer: Medicare HMO | Admitting: Neurology

## 2021-04-25 ENCOUNTER — Ambulatory Visit: Payer: Medicare Other

## 2021-04-25 DIAGNOSIS — M778 Other enthesopathies, not elsewhere classified: Secondary | ICD-10-CM

## 2021-04-25 DIAGNOSIS — M19079 Primary osteoarthritis, unspecified ankle and foot: Secondary | ICD-10-CM

## 2021-04-25 DIAGNOSIS — M7741 Metatarsalgia, right foot: Secondary | ICD-10-CM

## 2021-04-25 NOTE — Progress Notes (Signed)
SITUATION: ?Reason for Visit: Fitting and Delivery of Custom Fabricated Foot Orthoses ?Patient Report: Patient reports comfort and is satisfied with device. ? ?OBJECTIVE DATA: ?Patient History / Diagnosis:   ?  ICD-10-CM   ?1. Arthritis of metatarsophalangeal (MTP) joint of great toe  M19.079   ?  ?2. Metatarsalgia of both feet  M77.41   ? M77.42   ?  ?3. Capsulitis of foot, unspecified laterality  M77.8   ?  ? ? ?Provided Device:  Custom Functional Foot Orthotics ?    RicheyLAB: [id number] ? ?GOAL OF ORTHOSIS ?- Improve gait ?- Decrease energy expenditure ?- Improve Balance ?- Provide Triplanar stability of foot complex ?- Facilitate motion ? ?ACTIONS PERFORMED ?Patient was fit with foot orthotics trimmed to shoe last. Patient tolerated fittign procedure.  ? ?Patient was provided with verbal and written instruction and demonstration regarding donning, doffing, wear, care, proper fit, function, purpose, cleaning, and use of the orthosis and in all related precautions and risks and benefits regarding the orthosis. ? ?Patient was also provided with verbal instruction regarding how to report any failures or malfunctions of the orthosis and necessary follow up care. Patient was also instructed to contact our office regarding any change in status that may affect the function of the orthosis. ? ?Patient demonstrated independence with proper donning, doffing, and fit and verbalized understanding of all instructions. ? ?PLAN: ?Patient is to follow up in one week or as necessary (PRN). All questions were answered and concerns addressed. Plan of care was discussed with and agreed upon by the patient. ? ?

## 2021-06-29 DIAGNOSIS — N451 Epididymitis: Secondary | ICD-10-CM | POA: Diagnosis not present

## 2021-07-18 DIAGNOSIS — M25561 Pain in right knee: Secondary | ICD-10-CM | POA: Diagnosis not present

## 2021-07-18 DIAGNOSIS — M25512 Pain in left shoulder: Secondary | ICD-10-CM | POA: Diagnosis not present

## 2021-07-18 DIAGNOSIS — M25511 Pain in right shoulder: Secondary | ICD-10-CM | POA: Diagnosis not present

## 2021-08-13 DIAGNOSIS — M25511 Pain in right shoulder: Secondary | ICD-10-CM | POA: Diagnosis not present

## 2021-08-13 DIAGNOSIS — M25512 Pain in left shoulder: Secondary | ICD-10-CM | POA: Diagnosis not present

## 2021-08-22 DIAGNOSIS — M25561 Pain in right knee: Secondary | ICD-10-CM | POA: Diagnosis not present

## 2021-08-22 DIAGNOSIS — M25512 Pain in left shoulder: Secondary | ICD-10-CM | POA: Diagnosis not present

## 2021-08-22 DIAGNOSIS — M25511 Pain in right shoulder: Secondary | ICD-10-CM | POA: Diagnosis not present

## 2021-08-27 DIAGNOSIS — M25511 Pain in right shoulder: Secondary | ICD-10-CM | POA: Diagnosis not present

## 2021-08-27 DIAGNOSIS — M25512 Pain in left shoulder: Secondary | ICD-10-CM | POA: Diagnosis not present

## 2021-09-10 DIAGNOSIS — M25512 Pain in left shoulder: Secondary | ICD-10-CM | POA: Diagnosis not present

## 2021-09-10 DIAGNOSIS — M25511 Pain in right shoulder: Secondary | ICD-10-CM | POA: Diagnosis not present

## 2021-09-24 DIAGNOSIS — M25511 Pain in right shoulder: Secondary | ICD-10-CM | POA: Diagnosis not present

## 2021-09-24 DIAGNOSIS — M25512 Pain in left shoulder: Secondary | ICD-10-CM | POA: Diagnosis not present

## 2021-10-17 ENCOUNTER — Other Ambulatory Visit: Payer: Self-pay | Admitting: Sports Medicine

## 2021-10-17 DIAGNOSIS — M25512 Pain in left shoulder: Secondary | ICD-10-CM | POA: Diagnosis not present

## 2021-10-17 DIAGNOSIS — M25511 Pain in right shoulder: Secondary | ICD-10-CM | POA: Diagnosis not present

## 2021-10-17 DIAGNOSIS — M25561 Pain in right knee: Secondary | ICD-10-CM | POA: Diagnosis not present

## 2021-11-05 ENCOUNTER — Ambulatory Visit
Admission: RE | Admit: 2021-11-05 | Discharge: 2021-11-05 | Disposition: A | Discharge status: Home / Self Care | Payer: Medicare Other | Source: Ambulatory Visit | Attending: Sports Medicine | Admitting: Sports Medicine

## 2021-11-05 DIAGNOSIS — M25561 Pain in right knee: Secondary | ICD-10-CM | POA: Diagnosis not present

## 2021-11-05 DIAGNOSIS — R350 Frequency of micturition: Secondary | ICD-10-CM | POA: Diagnosis not present

## 2021-11-05 DIAGNOSIS — R3915 Urgency of urination: Secondary | ICD-10-CM | POA: Diagnosis not present

## 2021-11-06 DIAGNOSIS — U071 COVID-19: Secondary | ICD-10-CM | POA: Diagnosis not present

## 2021-12-14 DIAGNOSIS — H43393 Other vitreous opacities, bilateral: Secondary | ICD-10-CM | POA: Diagnosis not present

## 2022-02-07 DIAGNOSIS — G4700 Insomnia, unspecified: Secondary | ICD-10-CM | POA: Diagnosis not present

## 2022-02-07 DIAGNOSIS — I10 Essential (primary) hypertension: Secondary | ICD-10-CM | POA: Diagnosis not present

## 2022-02-07 DIAGNOSIS — R9389 Abnormal findings on diagnostic imaging of other specified body structures: Secondary | ICD-10-CM | POA: Diagnosis not present

## 2022-02-07 DIAGNOSIS — I700 Atherosclerosis of aorta: Secondary | ICD-10-CM | POA: Diagnosis not present

## 2022-02-07 DIAGNOSIS — Z0000 Encounter for general adult medical examination without abnormal findings: Secondary | ICD-10-CM | POA: Diagnosis not present

## 2022-02-07 DIAGNOSIS — N1831 Chronic kidney disease, stage 3a: Secondary | ICD-10-CM | POA: Diagnosis not present

## 2022-02-07 DIAGNOSIS — I129 Hypertensive chronic kidney disease with stage 1 through stage 4 chronic kidney disease, or unspecified chronic kidney disease: Secondary | ICD-10-CM | POA: Diagnosis not present

## 2022-02-07 DIAGNOSIS — E782 Mixed hyperlipidemia: Secondary | ICD-10-CM | POA: Diagnosis not present

## 2022-02-07 DIAGNOSIS — K30 Functional dyspepsia: Secondary | ICD-10-CM | POA: Diagnosis not present

## 2022-02-11 ENCOUNTER — Other Ambulatory Visit: Payer: Self-pay | Admitting: Family Medicine

## 2022-02-11 DIAGNOSIS — Z87891 Personal history of nicotine dependence: Secondary | ICD-10-CM

## 2022-02-11 NOTE — Progress Notes (Unsigned)
Cardiology Office Note:    Date:  02/13/2022   ID:  Randy Barrera, DOB 06-Sep-1955, MRN [id number]  PCP:  Lawerance Cruel, [place] [facility] Cardiologist:  Werner Lean, MD     Referring MD: Lawerance Cruel, MD   CC: CT scan follow up Consulted for the evaluation of CT findings at the behest of Dr. Harrington Challenger  History of Present Illness:    Randy Barrera is a 67 y.o. 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 (Multivessel CAC).  Patient notes that he is feeling good.   At baseline he is able to play with the dog and play golf. Walks about three miles with no symptoms.  Has had no chest pain, chest pressure, chest tightness, chest stinging   No shortness of breath, DOE .  No PND or orthopnea.  No weight gain, leg swelling , or abdominal swelling.  No syncope or near syncope . Notes  no palpitations or funny heart beats.     Patient reports prior cardiac testing including stress test four year ago prior to kidney donation.   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   PROSTATE BIOPSY  01/2019   RADIOACTIVE SEED IMPLANT N/A 04/29/2019   Procedure: RADIOACTIVE SEED IMPLANT/BRACHYTHERAPY IMPLANT;  Surgeon: Irine Seal, MD;  Location: [facility];  Service: Urology;  Laterality: N/A;   SPACE OAR INSTILLATION N/A 04/29/2019   Procedure: SPACE OAR INSTILLATION;  Surgeon: Irine Seal, MD;  [facility];  Service: Urology;  Laterality: N/A;    Current Medications: Current Meds  Medication Sig   acetaminophen (TYLENOL) 500 MG tablet Hold this medication while you are taking the narcotic pain pain medication   amLODipine  (NORVASC) 10 MG tablet    aspirin EC 81 MG tablet Take 1 tablet (81 mg total) by mouth daily. Swallow whole.   doxazosin (CARDURA) 8 MG tablet Take 8 mg by mouth daily.   esomeprazole (NEXIUM) 20 MG capsule daily at 6 (six) AM.   rosuvastatin (CRESTOR) 20 MG tablet Take 1 tablet (20 mg total) by mouth daily.   traZODone (DESYREL) 50 MG tablet Take 100 mg by mouth at bedtime as needed.   [DISCONTINUED] rosuvastatin (CRESTOR) 10 MG tablet at bedtime.      Allergies:   Lisinopril   Social History   Socioeconomic History   Marital status: Married    Spouse name: Pamala Hurry   Number of children: 1   Years of education: 16   Highest education level: Not on file  Occupational History    Comment: [employer]  Tobacco Use   Smoking status: Former    Packs/day: 2.00    Years: 20.00    Total pack years: 40.00    Types: Cigarettes    Quit date: 10/05/1994    Years since quitting: 27.3   Smokeless tobacco: Never  Vaping Use   Vaping Use: Never used  Substance and Sexual Activity   Alcohol use: Yes    Alcohol/week: 2.0 standard drinks of alcohol    Types: 2 Cans of beer per week    Comment: 2 burbons per day 4 oz  day   Drug use: No   Sexual activity: Not Currently  Other Topics Concern   Not on file  Social History Narrative   Lives at home with wife    Caffeine use- coffee- 2 daily   Social Determinants of Health   Financial Resource Strain: Not on file  Food Insecurity: Not on file  Transportation Needs: Not on file  Physical Activity: Not on file  Stress: Not on file  Social Connections: Not on file     Family History: The patient's family history includes Cancer in his paternal grandmother; Prostate cancer in his brother and father. There is no history of Breast cancer, Colon cancer, or Pancreatic cancer. Father had two MI's. Older brother has A fib.   ROS:   Please see the history of present illness.     All other systems reviewed and are negative.  EKGs/Labs/Other  Studies Reviewed:    The following studies were reviewed today:  EKG:  EKG is  ordered today.  The ekg ordered today demonstrates  02/13/22: Sinus bradycardia with occasional PACs, septal infarct  Echo Stress Testing : Date: 02/18/2017 Results: Negative stress test ECG Results - CUS Exercise Stress Echo W/Doppler (02/18/2017 2:38 PM EST) Narrative  Lynne Logan - 02/18/2017 2:55 PM EST  [facility] [place], [place]. [address] Stress Echocardiogram Report  Name: Randy Barrera, Randy Barrera   Study Date: 02/18/2017   Height: 72 in MRN: [id number]   Weight: 196 lb DOB: 06/01/1955   Gender: Male   BSA: 2.1 m2 Age: 64 yrsEthnicity: Caucasian   BP: 158/93 mmHg Reason For Study: Examination of potential donor of organ and tissue HR: 38  Ordering Physician: [id number] Billey Chang, AMBER Performed By: Levy Sjogren Fellow: Carley Hammed, MD [place] Referring Physician: Billey Chang, AMBER M - HISTORY Vit B12 deficiency. Ex-Tobacco. MEDS: Norvasc. Nexium. Desyrel. Vit B12. -  PROCEDURE Stress test was completed according to [facility] protocol. The test was stopped due to dyspnea. The patient had no chest pain. Target heart rate (85% of MPHR) was 134 . The maximal heart rate achieved was 155 bpm. The patient achieved 98 % of maximum predicted heart rate. The METS achieved was 12.7. Exercise capacity was good. The patient had resting hypertension and became exceedingly hypertensive with stress. - SUMMARY The patient had no chest pain. The patient achieved 98 % of maximum predicted heart rate. The METS achieved was 12.7. Exercise capacity was good. The baseline electrocardiogram was abnormal. It displayed sinus bradycardia. No diagnostic ST segment changes were seen. Negative stress ECG for inducible ischemia at target heart rate. ECG changes, blood pressure and heart rate responses during stress test are shown in the Cardiology Scan portion of this report in the  EPIC Normal left ventricular function at rest. Normal left ventricular function and global wall motion with stress. There were no segmental wall motion abnormalities post exercise Negative exercise echocardiography for inducible ischemia at target heart rate. - FINDINGS: - ECG REST The baseline electrocardiogram was abnormal. It displayed sinus bradycardia. - ECG STRESS No diagnostic ST segment changes were seen. Arrhythmia during stress: Isolated PACs. Arrhythmia noted in recovery: Occasional PACs and Isolated PVCs. Negative stress ECG for inducible ischemia at target heart rate. ECG changes, blood pressure and heart rate responses during stress test are shown in the Cardiology Scan portion of this report in the EPIC. - REST ECHO Normal left ventricular function at rest. There was normal left ventricular wall motion at rest. The estimated LV ejection fraction is 60-65% .  Left ventricular diastolic filling pattern:Indeterminate. - STRESS ECHO Normal left ventricular function and global wall motion with stress. There were no segmental wall motion abnormalities post exercise. The estimated LV ejection fraction is >70% with stress. Negative exercise echocardiography for inducible ischemia at target heart rate. -    Recent Labs: No results found for requested labs within last 365 days.  Recent Lipid Panel No results found for: "CHOL", "TRIG", "HDL", "CHOLHDL", "VLDL", "LDLCALC", "LDLDIRECT"          Physical Exam:    VS:  BP 126/68   Pulse (!) 59   Ht 5' 11.5" (1.816 m)   Wt 192 lb (87.1 kg)   SpO2 98%   BMI 26.41 kg/m     Wt Readings from Last 3 Encounters:  02/13/22 192 lb (87.1 kg)  11/02/20 189 lb 12.8 oz (86.1 kg)  04/29/19 185 lb 1.6 oz (84 kg)    GEN:  Well nourished, well developed in no acute distress HEENT: Normal NECK: Subtle R carotid bruit LYMPHATICS: No lymphadenopathy CARDIAC: RRR, no murmurs, rubs, gallops RESPIRATORY:  Clear to auscultation  without rales, wheezing or rhonchi  ABDOMEN: Soft, non-tender, non-distended MUSCULOSKELETAL:  No edema; No deformity  SKIN: Warm and dry NEUROLOGIC:  Alert and oriented x 3 PSYCHIATRIC:  Normal affect   ASSESSMENT:    1. Coronary artery calcification   2. Right carotid bruit   3. Former smoker   4. Aortic atherosclerosis (HCC)    PLAN:    CAC Former Tobacco abuse Aortic atherosclerosis HLD - reviewed CT scan with patient - low threshold for PET MPI review this with patient (consent if needed) - ASA 81 mg PO Daily - increase statin and labs, including Lp(a) in three months; LDL goal < 55; may add hs-CRP in future testing  R Carotid Bruit with R Carotid Calcium - Order Duplex  One year with me unless symptoms         Medication Adjustments/Labs and Tests Ordered: Current medicines are reviewed at length with the patient today.  Concerns regarding medicines are outlined above.  Orders Placed This Encounter  Procedures   ALT   Lipid panel   EKG 12-Lead   VAS US CAROTID   Meds ordered this encounter  Medications   aspirin EC 81 MG tablet    Sig: Take 1 tablet (81 mg total) by mouth daily. Swallow whole.    Dispense:  30 tablet    Refill:  12   rosuvastatin (CRESTOR) 20 MG tablet    Sig: Take 1 tablet (20 mg total) by mouth daily.    Dispense:  90 tablet    Refill:  3    Patient Instructions  Medication Instructions:  Your physician has recommended you make the following change in your medication:  START: Asprin 81 mg by mouth once daily  INCREASE: rosuvastatin (Crestor) to 20 mg by mouth once daily  *If you need a refill on your cardiac medications before your next appointment, please call your pharmacy*   Lab Work: IN 3 MONTHS: FLP, ALT (nothing to eat or drink 8-12 hours before except water and black coffee)  If you have labs (blood work) drawn today and your tests are completely normal, you will receive your results only by: [place] (if you  have MyChart) OR A paper copy in the mail If you have any lab test that is abnormal or we need to change your treatment, we will call you to review the results.   Testing/Procedures: Your  physician has requested that you have a carotid duplex. This test is an ultrasound of the carotid arteries in your neck. It looks at blood flow through these arteries that supply the brain with blood. Allow one hour for this exam. There are no restrictions or special instructions.    Follow-Up: At [facility], you and your health needs are our priority.  As part of our continuing mission to provide you with exceptional heart care, we have created designated Provider Care Teams.  These Care Teams include your primary Cardiologist (physician) and Advanced Practice Providers (APPs -  Physician Assistants and Nurse Practitioners) who all work together to provide you with the care you need, when you need it.     Your next appointment:   1 year(s)  Provider:   Werner Lean, MD       Signed, Werner Lean, MD  02/13/2022 12:25 PM    [place]

## 2022-02-13 ENCOUNTER — Ambulatory Visit: Payer: Medicare Other | Attending: Internal Medicine | Admitting: Internal Medicine

## 2022-02-13 ENCOUNTER — Encounter: Payer: Self-pay | Admitting: Internal Medicine

## 2022-02-13 VITALS — BP 126/68 | HR 59 | Ht 71.5 in | Wt 192.0 lb

## 2022-02-13 DIAGNOSIS — I2510 Atherosclerotic heart disease of native coronary artery without angina pectoris: Secondary | ICD-10-CM | POA: Diagnosis not present

## 2022-02-13 DIAGNOSIS — I2584 Coronary atherosclerosis due to calcified coronary lesion: Secondary | ICD-10-CM

## 2022-02-13 DIAGNOSIS — I700 Atherosclerosis of aorta: Secondary | ICD-10-CM | POA: Diagnosis not present

## 2022-02-13 DIAGNOSIS — R0989 Other specified symptoms and signs involving the circulatory and respiratory systems: Secondary | ICD-10-CM | POA: Diagnosis not present

## 2022-02-13 DIAGNOSIS — Z87891 Personal history of nicotine dependence: Secondary | ICD-10-CM

## 2022-02-13 MED ORDER — ROSUVASTATIN CALCIUM 20 MG PO TABS: 20.0000 mg | ORAL_TABLET | Freq: Every day | ORAL | 3 refills | Status: DC

## 2022-02-13 MED ORDER — ASPIRIN 81 MG PO TBEC: 81.0000 mg | DELAYED_RELEASE_TABLET | Freq: Every day | ORAL | 12 refills | Status: AC

## 2022-02-13 NOTE — Patient Instructions (Signed)
Medication Instructions:  Your physician has recommended you make the following change in your medication:  START: Asprin 81 mg by mouth once daily  INCREASE: rosuvastatin (Crestor) to 20 mg by mouth once daily  *If you need a refill on your cardiac medications before your next appointment, please call your pharmacy*   Lab Work: IN 3 MONTHS: FLP, ALT (nothing to eat or drink 8-12 hours before except water and black coffee)  If you have labs (blood work) drawn today and your tests are completely normal, you will receive your results only by: [place] (if you have MyChart) OR A paper copy in the mail If you have any lab test that is abnormal or we need to change your treatment, we will call you to review the results.   Testing/Procedures: Your physician has requested that you have a carotid duplex. This test is an ultrasound of the carotid arteries in your neck. It looks at blood flow through these arteries that supply the brain with blood. Allow one hour for this exam. There are no restrictions or special instructions.    Follow-Up: At [facility], you and your health needs are our priority.  As part of our continuing mission to provide you with exceptional heart care, we have created designated Provider Care Teams.  These Care Teams include your primary Cardiologist (physician) and Advanced Practice Providers (APPs -  Physician Assistants and Nurse Practitioners) who all work together to provide you with the care you need, when you need it.     Your next appointment:   1 year(s)  Provider:   Werner Lean, MD

## 2022-02-19 ENCOUNTER — Encounter: Payer: Self-pay | Admitting: Internal Medicine

## 2022-02-20 DIAGNOSIS — M25561 Pain in right knee: Secondary | ICD-10-CM | POA: Diagnosis not present

## 2022-02-22 ENCOUNTER — Ambulatory Visit (HOSPITAL_COMMUNITY)
Admission: RE | Admit: 2022-02-22 | Discharge: 2022-02-22 | Disposition: A | Discharge status: Home / Self Care | Payer: Medicare Other | Source: Ambulatory Visit | Attending: Cardiovascular Disease | Admitting: Cardiovascular Disease

## 2022-02-22 ENCOUNTER — Telehealth: Payer: Self-pay

## 2022-02-22 DIAGNOSIS — R072 Precordial pain: Secondary | ICD-10-CM

## 2022-02-22 DIAGNOSIS — R0989 Other specified symptoms and signs involving the circulatory and respiratory systems: Secondary | ICD-10-CM | POA: Insufficient documentation

## 2022-02-22 NOTE — Telephone Encounter (Signed)
The patient has been notified of the result and verbalized understanding.  All questions (if any) were answered. Precious Gilding, RN 02/22/2022 3:39 PM   Reviewed Cardiac PET scan instructions with pt also placed on my chart for pt to review.

## 2022-02-22 NOTE — Telephone Encounter (Signed)
-----   Message from Werner Lean, MD sent at 02/22/2022  2:44 PM EST ----- Results: Mild disease Plan: PET MPI  Werner Lean, MD

## 2022-02-27 DIAGNOSIS — K219 Gastro-esophageal reflux disease without esophagitis: Secondary | ICD-10-CM | POA: Diagnosis not present

## 2022-03-04 DIAGNOSIS — M25561 Pain in right knee: Secondary | ICD-10-CM | POA: Diagnosis not present

## 2022-03-05 ENCOUNTER — Other Ambulatory Visit: Payer: Self-pay | Admitting: Family Medicine

## 2022-03-05 ENCOUNTER — Ambulatory Visit
Admission: RE | Admit: 2022-03-05 | Discharge: 2022-03-05 | Disposition: A | Discharge status: Home / Self Care | Payer: Medicare Other | Source: Ambulatory Visit | Attending: Family Medicine | Admitting: Family Medicine

## 2022-03-05 DIAGNOSIS — Z87891 Personal history of nicotine dependence: Secondary | ICD-10-CM

## 2022-03-08 DIAGNOSIS — K2970 Gastritis, unspecified, without bleeding: Secondary | ICD-10-CM | POA: Diagnosis not present

## 2022-03-08 DIAGNOSIS — K449 Diaphragmatic hernia without obstruction or gangrene: Secondary | ICD-10-CM | POA: Diagnosis not present

## 2022-03-08 DIAGNOSIS — K219 Gastro-esophageal reflux disease without esophagitis: Secondary | ICD-10-CM | POA: Diagnosis not present

## 2022-03-08 DIAGNOSIS — R1013 Epigastric pain: Secondary | ICD-10-CM | POA: Diagnosis not present

## 2022-03-08 DIAGNOSIS — K319 Disease of stomach and duodenum, unspecified: Secondary | ICD-10-CM | POA: Diagnosis not present

## 2022-03-08 DIAGNOSIS — K317 Polyp of stomach and duodenum: Secondary | ICD-10-CM | POA: Diagnosis not present

## 2022-03-11 DIAGNOSIS — M25561 Pain in right knee: Secondary | ICD-10-CM | POA: Diagnosis not present

## 2022-03-12 DIAGNOSIS — K319 Disease of stomach and duodenum, unspecified: Secondary | ICD-10-CM | POA: Diagnosis not present

## 2022-03-12 DIAGNOSIS — K317 Polyp of stomach and duodenum: Secondary | ICD-10-CM | POA: Diagnosis not present

## 2022-03-13 DIAGNOSIS — M25561 Pain in right knee: Secondary | ICD-10-CM | POA: Diagnosis not present

## 2022-03-20 DIAGNOSIS — M25561 Pain in right knee: Secondary | ICD-10-CM | POA: Diagnosis not present

## 2022-03-21 DIAGNOSIS — M25561 Pain in right knee: Secondary | ICD-10-CM | POA: Diagnosis not present

## 2022-03-29 ENCOUNTER — Ambulatory Visit: Payer: Medicare Other

## 2022-04-08 ENCOUNTER — Telehealth (HOSPITAL_COMMUNITY): Payer: Self-pay | Admitting: Emergency Medicine

## 2022-04-08 ENCOUNTER — Other Ambulatory Visit: Payer: Self-pay | Admitting: Internal Medicine

## 2022-04-08 DIAGNOSIS — I2510 Atherosclerotic heart disease of native coronary artery without angina pectoris: Secondary | ICD-10-CM

## 2022-04-09 ENCOUNTER — Telehealth (HOSPITAL_COMMUNITY): Payer: Self-pay | Admitting: Emergency Medicine

## 2022-04-09 NOTE — Telephone Encounter (Signed)
Reaching out to patient to offer assistance regarding upcoming cardiac imaging study; pt verbalizes understanding of appt date/time, parking situation and where to check in, pre-test NPO status and medications ordered, and verified current allergies; name and call back number provided for further questions should they arise Marchia Bond RN Navigator Cardiac Imaging [facility] [phone] office [phone] cell  Arrival [address], radiology Denies iv issues No caffeine No food Daily meds

## 2022-04-10 ENCOUNTER — Encounter (HOSPITAL_COMMUNITY)
Admission: RE | Admit: 2022-04-10 | Discharge: 2022-04-10 | Disposition: A | Discharge status: Home / Self Care | Payer: Medicare Other | Source: Ambulatory Visit | Attending: Internal Medicine | Admitting: Internal Medicine

## 2022-04-10 DIAGNOSIS — R072 Precordial pain: Secondary | ICD-10-CM | POA: Diagnosis not present

## 2022-04-10 LAB — NM PET CT CARDIAC PERFUSION MULTI W/ABSOLUTE BLOODFLOW
MBFR: 2.45
Nuc Rest EF: 59 %
Nuc Stress EF: 66 %
Rest MBF: 0.6 ml/g/min
Rest Nuclear Isotope Dose: 22.4 mCi
ST Depression (mm): 0 mm
Stress MBF: 1.47 ml/g/min
Stress Nuclear Isotope Dose: 22.6 mCi
TID: 1.07

## 2022-04-10 MED ORDER — RUBIDIUM RB82 GENERATOR (RUBYFILL)
22.4000 | PACK | Freq: Once | INTRAVENOUS | Status: AC
  Administered 2022-04-10: 22.4 via INTRAVENOUS

## 2022-04-10 MED ORDER — RUBIDIUM RB82 GENERATOR (RUBYFILL)
22.4000 | PACK | Freq: Once | INTRAVENOUS | Status: AC
  Administered 2022-04-10: 22.6 via INTRAVENOUS

## 2022-04-10 MED ORDER — REGADENOSON 0.4 MG/5ML IV SOLN: 0.4000 mg | Freq: Once | INTRAVENOUS | Status: AC

## 2022-04-10 MED ORDER — REGADENOSON 0.4 MG/5ML IV SOLN
INTRAVENOUS | Status: AC
  Administered 2022-04-10: 0.4 mg via INTRAVENOUS
  Filled 2022-04-10: qty 5

## 2022-04-12 ENCOUNTER — Ambulatory Visit: Payer: Medicare Other

## 2022-04-24 ENCOUNTER — Other Ambulatory Visit: Payer: Medicare Other

## 2022-05-03 ENCOUNTER — Ambulatory Visit: Payer: Medicare Other | Attending: Internal Medicine

## 2022-05-03 DIAGNOSIS — I2510 Atherosclerotic heart disease of native coronary artery without angina pectoris: Secondary | ICD-10-CM | POA: Diagnosis not present

## 2022-05-03 DIAGNOSIS — I2584 Coronary atherosclerosis due to calcified coronary lesion: Secondary | ICD-10-CM | POA: Diagnosis not present

## 2022-05-04 LAB — LIPID PANEL
Chol/HDL Ratio: 2.6 ratio (ref 0.0–5.0)
Cholesterol, Total: 174 mg/dL (ref 100–199)
HDL: 66 mg/dL (ref >39)
LDL Chol Calc (NIH): 95 mg/dL (ref 0–99)
Triglycerides: 68 mg/dL (ref 0–149)
VLDL Cholesterol Cal: 13 mg/dL (ref 5–40)

## 2022-05-04 LAB — ALT: ALT: 12 IU/L (ref 0–44)

## 2022-05-07 ENCOUNTER — Telehealth: Payer: Self-pay

## 2022-05-07 NOTE — Telephone Encounter (Signed)
Left message to call back  

## 2022-05-08 ENCOUNTER — Encounter: Payer: Self-pay | Admitting: Internal Medicine

## 2022-05-08 ENCOUNTER — Telehealth: Payer: Self-pay | Admitting: Internal Medicine

## 2022-05-08 DIAGNOSIS — I700 Atherosclerosis of aorta: Secondary | ICD-10-CM

## 2022-05-08 DIAGNOSIS — I2510 Atherosclerotic heart disease of native coronary artery without angina pectoris: Secondary | ICD-10-CM

## 2022-05-08 NOTE — Telephone Encounter (Signed)
Patient returned call for lab results.  

## 2022-05-08 NOTE — Telephone Encounter (Signed)
Left a message to call back.

## 2022-05-08 NOTE — Telephone Encounter (Signed)
error 

## 2022-05-09 MED ORDER — ROSUVASTATIN CALCIUM 40 MG PO TABS: 40.0000 mg | ORAL_TABLET | Freq: Every day | ORAL | 3 refills | Status: DC

## 2022-05-10 NOTE — Telephone Encounter (Signed)
Please see encounter from 05/09/22 for more details.

## 2022-05-10 NOTE — Telephone Encounter (Signed)
Please see my chart encounter from 05/09/22 for more details.

## 2022-05-14 ENCOUNTER — Other Ambulatory Visit: Payer: Self-pay | Admitting: Urology

## 2022-05-15 ENCOUNTER — Other Ambulatory Visit: Payer: Self-pay | Admitting: Urology

## 2022-05-17 ENCOUNTER — Ambulatory Visit: Payer: Medicare Other

## 2022-05-21 ENCOUNTER — Telehealth: Payer: Self-pay | Admitting: Internal Medicine

## 2022-05-21 NOTE — Telephone Encounter (Signed)
Called patient- reviewed questions. No perfusion defects. No qualitative change in CAC from 02/2021 Chest CT Aggressive prevention planned. He is tolerated medication therapy well

## 2022-05-29 ENCOUNTER — Ambulatory Visit
Admission: RE | Admit: 2022-05-29 | Discharge: 2022-05-29 | Disposition: A | Discharge status: Home / Self Care | Payer: Medicare Other | Source: Ambulatory Visit | Attending: Family Medicine | Admitting: Family Medicine

## 2022-05-29 DIAGNOSIS — I700 Atherosclerosis of aorta: Secondary | ICD-10-CM | POA: Diagnosis not present

## 2022-05-29 DIAGNOSIS — Z87891 Personal history of nicotine dependence: Secondary | ICD-10-CM | POA: Diagnosis not present

## 2022-06-06 DIAGNOSIS — D485 Neoplasm of uncertain behavior of skin: Secondary | ICD-10-CM | POA: Diagnosis not present

## 2022-07-03 ENCOUNTER — Encounter (HOSPITAL_BASED_OUTPATIENT_CLINIC_OR_DEPARTMENT_OTHER): Payer: Self-pay | Admitting: Urology

## 2022-07-03 NOTE — Progress Notes (Addendum)
Spoke w/ via phone for pre-op interview--- [name] Lab needs dos----               Lab results------Current EKG in Epic dated 02/13/22. COVID test -----patient states asymptomatic no test needed Arrive at -------0530 NPO after MN NO Solid Food.   Med rec completed Medications to take morning of surgery -----Norvasc and Nexium Diabetic medication ----- Patient instructed no nail polish to be worn day of surgery Patient instructed to bring photo id and insurance card day of surgery Patient aware to have Driver (ride ) / caregiver Wife Gust Salz   for 24 hours after surgery  Patient Special Instructions ----- Pre-Op special Instructions ----- Patient verbalized understanding of instructions that were given at this phone interview. Patient denies shortness of breath, chest pain, fever, cough at this phone interview.   Cardiologist LOV 02/13/22 F?U one year.

## 2022-07-09 DIAGNOSIS — L570 Actinic keratosis: Secondary | ICD-10-CM | POA: Diagnosis not present

## 2022-07-09 DIAGNOSIS — C44519 Basal cell carcinoma of skin of other part of trunk: Secondary | ICD-10-CM | POA: Diagnosis not present

## 2022-07-09 DIAGNOSIS — D485 Neoplasm of uncertain behavior of skin: Secondary | ICD-10-CM | POA: Diagnosis not present

## 2022-07-22 NOTE — Anesthesia Preprocedure Evaluation (Signed)
Anesthesia Evaluation  Patient identified by MRN, date of birth, ID band Patient awake    Reviewed: Allergy & Precautions, NPO status , Patient's Chart, lab work & pertinent test results  Airway Mallampati: I  TM Distance: >3 FB Neck ROM: Full    Dental  (+) Missing, Chipped, Dental Advisory Given,    Pulmonary former smoker   Pulmonary exam normal breath sounds clear to auscultation       Cardiovascular hypertension, Pt. on medications + CAD  Normal cardiovascular exam Rhythm:Regular Rate:Normal  CT/NM Coronary Ca++ and perfusion 03/2022   LV perfusion is normal. There is no evidence of ischemia. There is no evidence of infarction.   Rest left ventricular function is normal. Rest EF: 59 %. Stress left ventricular function is normal. Stress EF: 66 %. End diastolic cavity size is normal. End systolic cavity size is normal.   Myocardial blood flow was computed to be 0.36ml/g/min at rest and 1.66ml/g/min at stress. Global myocardial blood flow reserve was 2.45 and was normal.   Coronary calcium was present on the attenuation correction CT images. Severe coronary calcifications were present. Coronary calcifications were present in the left anterior descending artery, left circumflex artery and right coronary artery distribution(s).   The study is normal. The study is low risk.   Electronically signed by Epifanio Lesches, MD    Neuro/Psych negative neurological ROS  negative psych ROS   GI/Hepatic Neg liver ROS,GERD  Medicated and Controlled,,  Endo/Other  negative endocrine ROS    Renal/GU Renal disease     Musculoskeletal  (+) Arthritis ,    Abdominal   Peds  Hematology HLD   Anesthesia Other Findings   Reproductive/Obstetrics                             Anesthesia Physical Anesthesia Plan  ASA: 2  Anesthesia Plan: General   Post-op Pain Management: Tylenol PO (pre-op)*    Induction: Intravenous  PONV Risk Score and Plan: 3 and Midazolam, Dexamethasone, Ondansetron and Treatment may vary due to age or medical condition  Airway Management Planned: LMA  Additional Equipment: None  Intra-op Plan:   Post-operative Plan: Extubation in OR  Informed Consent: I have reviewed the patients History and Physical, chart, labs and discussed the procedure including the risks, benefits and alternatives for the proposed anesthesia with the patient or authorized representative who has indicated his/her understanding and acceptance.     Dental advisory given  Plan Discussed with: CRNA  Anesthesia Plan Comments:         Anesthesia Quick Evaluation

## 2022-07-22 NOTE — H&P (Signed)
CC: Prostate Cancer, Microhematuria.   05/08/22: Randy Barrera returns today in f/u for his history below. His history is down to 0.41 from 0.73. He is 2 years post seeds. 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 His IPSS is 5 with nocturia x 2 but he did have increased nocturia a month ago but that has settled down. He has had no hematuria or dysuria. He is using sildenafil for the ED with a variable response. He has a left hydrocele that is more bothersome and he is interested in repair.   11/05/21: Randy Barrera returns today in f/u from a seed implant for Gleason 7(3+4) T1c N0Mx prostate cancer on 04/29/19. His PSA continues to decline and is down to 0.73 from 1.61 at last check. He has had no hematuria. His IPSS is 11 with nocturia x 3 and urgency. His LUTS improved but then have worsened again. He remains on doxazosin 4mg  daily. He has an adequate stream and is emptying well but with some intermittency. His UA is clear. He has left scrotal swelling that got worse in the summer but it then improved. He has stable erectile function and no further hematospermia. He has a stable weight but no bone pain.   06/29/2021: Randy Barrera is a 67 year old man who presents today with concerns of left-sided scrotal swelling that has been gradual over the last 6 weeks. He does not have any pain but does endorse discomfort due to the size of the swelling with ambulation. He is not tender to touch. He denies fevers, chills. He denies any issues voiding. He has known hydroceles and spermatoceles. He is concerned because the swelling has increased and become quite uncomfortable.   04/17/21: Randy Barrera returns today in f/u for the history below. His PSA is down further to 1.62 prior to this visit. He remains on doxazosin for LUTS and PDE5's for ED. He is doing well with nocturia x 1. He has a good stream. He has had no hematuria. He has no recent weight loss or bone pain. He has no GI complaints. He would like a refill on the sildenafil. He still has  some swelling in the left scrotum from the spermatoceles and hydrocele.   10/04/20: Randy Barrera returns today in f/u. he had microhematuria on his recent visit and was to have a CT hematuria study but that hasn't been done. He is scheduled for cystoscopy today.   09/27/20: Randy Barrera returns today in f/u from a seed implant for Gleason 7(3+4) T1c N0Mx prostate cancer on 04/29/19. His PSA prior to this visit is down to 1.7 from 1.83 6 months ago. He has 2 brothers and his father who have had prostate cancer and one older brother who has not. He has increased LUTS with nocturia x 3 and his IPSS is back up to 13 from 5. He remains on doxazosin to 4mg  daily. He has ED and has sildenafil and tadalafil and is using them as needed and they are working. He would like to continue the sildenafil. he has had some bloody semen over the last month which has gotten progressively worse over the last 5 ejaculations. He has microhematuria today. He is back to 1 BM daily. He had genetic testing in 2020 and was negative for prostate cancer associated mutations.   GU Hx;   Randy Barrera is a 67 year old gentleman who was initially diagnosed with very low risk prostate cancer in November 2019 with a PSA of 2.12 and 1 out of 12 biopsy cores with Gleason 3+3=6  adenocarcinoma on his TRUS biopsy from 11//20/19. He elected active surveillance management. An MRI of the prostate on 11/19/18 showed a 1.3 cm PI-RADS 3 lesion at the left base. He underwent an MR/US fusion biopsy on 01/27/18 that demonstrated Gleason 3+4=7 adenocarcinoma with 7 out of 16 biopsy cores positive including 3 out of 3 targeted ROI biopsies.   Family history: Brother and father. His father died in his mid 49s for metastatic disease. His brother was treated in his early 72s with a radical prostatectomy and is doing well.   Imaging studies:  MRI (01/28/19): No SVI, EPE, or LAD. No bone lesions.   PMH: He has a history of GERD, hypertension, hyperlipidemia, and history of  gastric ulcer.  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   TNM stage: cT1c N0 Mx  PSA: 2.5  Gleason score: 3+4=7 (Grade group 2)  Biopsy (01/28/19): 7/16 cores positive  Left: L mid (5%, 3+3=6), L lateral base (<5%, 3+4=7), L base (40%, 3+4=7)  Right: R lateral apex (10%, 3+4=7)  MR target: 3/3 cores, 3+4=7 (60%, 60%, 20%)  Prostate volume: 42.0 cc   Nomogram  OC disease: 72%  EPE: 26%  SVI: 2%  LNI: 2%  PFS (5 year, 10 year): 93%, 87%   Urinary function: IPSS is 4.  Erectile function: SHIM score is 6. He has had very inconsistent ability to get an erection over many years. The PDE 5 inhibitors have not typically been helpful for him. This is currently a lower priority.    05/20/19: Patient with above noted hx. He is s/pc brachytherapy with placement of 77 seeds and SpaceOAR on 4/15. Overall, he states that he is doing well following his procedure. He denies any significant pain or dysuria. He states that he has noted some increased hesitancy, straining, and weaker urinary stream. He generally feels he is able to empty well. He states that symptoms are minimally bothersome. No complaints of fevers, chills, nausea, vomiting, or constipation.     ALLERGIES: Lisinopril - Hives    MEDICATIONS: Omeprazole 20 mg capsule,delayed release  Amlodipine Besylate 10 mg tablet  Doxazosin Mesylate 8 mg tablet 1 tablet PO Daily  Doxazosin Mesylate 4 mg tablet 1 tablet PO Daily  Rosuvastatin Calcium  Sildenafil Citrate 100 mg tablet 1 tablet PO PRN  Sildenafil Citrate 100 mg tablet 1 tablet PO prn Ta  Sildenafil Citrate 100 mg tablet 1 tablet PO Daily PRN  Trazodone Hcl 100 mg tablet  Tylenol     GU PSH: Cystoscopy - 10/04/2020 Lap Donor Nephrectomy - 2019 Locm 300-399Mg /Ml Iodine,1Ml - 10/04/2020 Prostate Needle Biopsy - 2021, 2019 TRANSPERI NEEDLE PLACE, PROS - 2021 Transperineal Plmt Biodegradable Matrl 1/Mlt Njx - 2021     NON-GU PSH: Foot surgery (unspecified),  Right Shoulder Arthroscopy/surgery, Right Surgical Pathology, Gross And Microscopic Examination For Prostate Needle - 2021, 2019     GU PMH: BPH w/LUTS, He got better but is now worse with increased urgency and nocturia. I discussed dietary irritants and will increase the doxazosin to 8mg . - 11/05/2021, His voiding symptoms have improved. I discussed stopping the doxazosin but he likes it for the BP effect and will continue the med. . 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 , - 10/04/2020, His irritative symptoms and nocturia have worsened. I will do cystoscopy with the hematuria w/u to assess his symptoms. , - 09/27/2020, - 2022, He has progressive LUTS following the seed implant with nocturia as  the primary complaint. I will increase the doxazosin to 4mg ., - 2021 ED due to arterial insufficiency, Sildenafil refilled. - 11/05/2021, Sildenafil refilled. I discussed combination therapy with tadalafil and shockwave therapy as well. , - 04/17/2021, He is having a better response to the sildenafil which we refilled. , - 09/27/2020, HE has severe ED that doesn't respond to oral meds or injection therapy. I discussed the VED, FirmEST and the IPP. He is not interested in treatment at this time. , - 2022, He has persistent ED with minimal response to meds. , - 2020 (Stable), He is responding to sildenafil and tadalafil., - 2020 (Stable), He didn't respond to [place] but has responded to combination sildenafil and tadalafil in the past so he will be given scripts for those meds. 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 I discussed Oral meds, another attempt at injections, VED, LIEST and IPP. He was given [place] 200mg  samples and a script. , - 2019 History of prostate cancer, His PSA is still falling. Repeat in 6 months. - 11/05/2021, His voiding symptoms have improved. I discussed stopping the doxazosin but he likes it for  the BP effect and will continue the med. . , - 04/17/2021 Hydrocele, The hydrocele is back to baseline. I discussed indications for treatment. - 11/05/2021, - 06/29/2021 Nocturia (Stable) - 11/05/2021, - 04/17/2021, - 09/27/2020, - 2022, - 2021 Urinary Frequency - 11/05/2021 Urinary Urgency - 11/05/2021 Epididymitis - 06/29/2021, He has mild left scrotal swelling with possible left epdidiymitis. I am going to culture the urine and start bactrim for 10 days. , - 10/04/2020 Microscopic hematuria, He has some increased hematuria today but no renal or bladder abnormalities. I will repeat a UA in 3 weeks and may consider cytology if there is further hematuria. - 10/04/2020, He has hematospermia and microhematuria. I am going to have him return with a CT hematuria study for cystoscopy.. , - 09/27/2020 Prostate Cancer, He will keep his f/u in March with a PSA. - 10/04/2020, PSA continues to fall. I will get a repeat in 6 months. , - 09/27/2020, He is doing well s/p seeds and the PSA is falling slowly. His voiding symptoms have improved with the increase in the doxazosin. , - 2022, His PSA is starting to fall following a seed implant. I will have him return in 6 months with a PSA. , - 2021, - 2021, He has a PIRADS 3 lesion on the MRI at the left base where his prior cancer was found. I will get him set up for an MR Fusion biopsy. I have reviewed the risks of bleeding, infection and voiding difficulty. Levaquin and Valium sent. , - 2020 (Stable), His PSA is fairly stable and his exam is benign. I will have him return in 3 months with a PSA and MRIP. , - 2020, His PSA is down and he is doing well. He will return in 3 months with a PSA for an exam. , - 2020, T1c Nx Mx Gleason 6 very low risk prostate cancer. I discussed AS, RALP, EXRT, seeds, Cryo and HIFU. He is most interested in AS. I discussed genomic testing and genetic counseling with his strong family history and I will have him see the Dent[facility] the [facility]. F/u in 3 months with a PSA. , - 2019 Hematospermia - 09/27/2020 Elevated PSA - 2022, - 2021, - 2019, He has an elevated PSA with a strong family history of cancer. I will get him  set up for a biopsy and reviewed the risks of bleeding, infection and voiding difficulty. 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 - 2020      PMH Notes:  1898-01-14 00:00:00 - Note: Normal Routine History And Physical Adult   NON-GU PMH: Solitary pulmonary nodule, He has stable to improved pulmonary nodules of a Chest CT done in f/u of his prior CT hematuria study. I will defer the decision on further imaging to Dr. Tenny Craw. - 04/17/2021 Renal agenesis, unilateral, He has a solitary right kidney. - 10/04/2020 Acute gastric ulcer with hemorrhage GERD Hypercholesterolemia Hypertension Skin Cancer, History    FAMILY HISTORY: 1 Daughter - Other Emphysema - Mother Prostate Cancer - Father, Brother Tuberculosis - Father   SOCIAL HISTORY: Marital Status: Married Preferred Language: English; Ethnicity: Unknown; Race: White Current Smoking Status: Patient does not smoke anymore. Has not smoked since 10/15/1994.   Tobacco Use Assessment Completed: Used Tobacco in last 30 days? Does not use smokeless tobacco. Does drink.  Does not use drugs. Drinks 2 caffeinated drinks per day. Has not had a blood transfusion. 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     Notes: [place] 2-3 drinks per night (approx 6 ounces) most nights   REVIEW OF SYSTEMS:    GU Review Male:   Patient reports get up at night to urinate and trouble starting your stream. Patient denies frequent urination, hard to postpone urination, burning/ pain with urination, leakage of urine, stream starts and stops, have to strain to urinate , erection problems, and penile pain.  Gastrointestinal (Upper):   Patient denies nausea, vomiting,  and indigestion/ heartburn.  Gastrointestinal (Lower):   Patient denies diarrhea and constipation.  Constitutional:   Patient denies fever, night sweats, weight loss, and fatigue.  Skin:   Patient denies skin rash/ lesion and itching.  Eyes:   Patient denies blurred vision and double vision.  Ears/ Nose/ Throat:   Patient denies sore throat and sinus problems.  Hematologic/Lymphatic:   Patient denies swollen glands and easy bruising.  Cardiovascular:   Patient denies leg swelling and chest pains.  Respiratory:   Patient denies cough and shortness of breath.  Endocrine:   Patient denies excessive thirst.  Musculoskeletal:   Patient denies back pain and joint pain.  Neurological:   Patient denies headaches and dizziness.  Psychologic:   Patient denies depression and anxiety.   VITAL SIGNS: None   GU PHYSICAL EXAMINATION:    Scrotum: No lesions. No edema. No cysts. No warts. there is a >5cm left hydrocele and the testicle and epididymis are not palpable. this is stable. Right testes and epididymis are normal.   MULTI-SYSTEM PHYSICAL EXAMINATION:    Constitutional: Well-nourished. No physical deformities. Normally developed. Good grooming.  Respiratory: Normal breath sounds. No labored breathing, no use of accessory muscles.   Cardiovascular: Regular rate and rhythm. No murmur, no gallop.      Complexity of Data:  Lab Test Review:   PSA  Records Review:   AUA Symptom Score, Previous Doctor Records, Previous Patient Records  Urine Test Review:   Urinalysis   05/01/22 11/05/21 03/26/21 09/20/20 03/22/20 09/06/19 11/18/18 08/17/18  PSA  Total PSA 0.41 ng/mL 0.73 ng/mL 1.62 ng/mL 1.70 ng/mL 1.83 ng/mL 2.16 ng/mL 2.50 ng/mL 2.22 ng/mL    PROCEDURES:          Visit Complexity - G2211 Chronic management of a history of  prostate cancer and BPH with BOO.          Urinalysis Dipstick Dipstick Cont'd  Color: Yellow Bilirubin: Neg mg/dL  Appearance: Clear Ketones: Neg mg/dL  Specific  Gravity: 1.610 Blood: Neg ery/uL  pH: <=5.0 Protein: Neg mg/dL  Glucose: Neg mg/dL Urobilinogen: 0.2 mg/dL    Nitrites: Neg    Leukocyte Esterase: Neg leu/uL    ASSESSMENT:      ICD-10 Details  1 GU:   History of prostate cancer - Z85.46 Chronic, Stable, Improving - PSA is down further. Repeat in a year.   2   BPH w/LUTS - N40.1 Chronic, Stable - He will continue the doxazosin.   3   Nocturia - R35.1 Chronic, Stable  4   ED due to arterial insufficiency - N52.01 Chronic, Worsening - I discussed optons and will have him try tadalafil 10mg  with sildenafil 50mg  which we dispensed.   5   Hydrocele - N43.0 Chronic, Worsening - He has increased bother and would like a hydrocelectomy. I have reviewed the risks of bleeding, infection, recurrence, testicular or vasal injury, chronic pain, thrombotic events and anesthetic complications.    PLAN:           Schedule Labs: 1 Year - PSA    1 Year - Urinalysis  Return Visit/Planned Activity: 1 Year - Office Visit  Return Visit/Planned Activity: Next Available Appointment - Schedule Surgery  Procedure: Unspecified Date - Hydrocele repair - [phone], left Notes: Next available.          Document Letter(s):  Created for Patient: Clinical Summary         Notes:   CC: Dr. Vianne Bulls        Next Appointment:      Next Appointment: 10/30/2022 08:45 AM    Appointment Type: Laboratory Appointment    Location: [facility] [phone]    Provider: Lab LAB    Reason for Visit: 6 mo PSA

## 2022-07-23 ENCOUNTER — Encounter (HOSPITAL_BASED_OUTPATIENT_CLINIC_OR_DEPARTMENT_OTHER): Payer: Self-pay | Admitting: Urology

## 2022-07-23 ENCOUNTER — Ambulatory Visit (HOSPITAL_BASED_OUTPATIENT_CLINIC_OR_DEPARTMENT_OTHER): Payer: Self-pay | Admitting: Anesthesiology

## 2022-07-23 ENCOUNTER — Ambulatory Visit (HOSPITAL_BASED_OUTPATIENT_CLINIC_OR_DEPARTMENT_OTHER): Payer: Medicare Other | Admitting: Anesthesiology

## 2022-07-23 ENCOUNTER — Other Ambulatory Visit: Payer: Self-pay

## 2022-07-23 ENCOUNTER — Encounter (HOSPITAL_BASED_OUTPATIENT_CLINIC_OR_DEPARTMENT_OTHER)
Admission: RE | Disposition: A | Discharge status: Home / Self Care | Payer: Self-pay | Source: Home / Self Care | Attending: Urology

## 2022-07-23 ENCOUNTER — Ambulatory Visit (HOSPITAL_BASED_OUTPATIENT_CLINIC_OR_DEPARTMENT_OTHER)
Admission: RE | Admit: 2022-07-23 | Discharge: 2022-07-23 | Disposition: A | Discharge status: Home / Self Care | Payer: Medicare Other | Attending: Urology | Admitting: Urology

## 2022-07-23 DIAGNOSIS — I2510 Atherosclerotic heart disease of native coronary artery without angina pectoris: Secondary | ICD-10-CM | POA: Diagnosis not present

## 2022-07-23 DIAGNOSIS — N4342 Spermatocele of epididymis, multiple: Secondary | ICD-10-CM | POA: Insufficient documentation

## 2022-07-23 DIAGNOSIS — Z08 Encounter for follow-up examination after completed treatment for malignant neoplasm: Secondary | ICD-10-CM | POA: Insufficient documentation

## 2022-07-23 DIAGNOSIS — N320 Bladder-neck obstruction: Secondary | ICD-10-CM | POA: Diagnosis not present

## 2022-07-23 DIAGNOSIS — Z79899 Other long term (current) drug therapy: Secondary | ICD-10-CM | POA: Diagnosis not present

## 2022-07-23 DIAGNOSIS — Z09 Encounter for follow-up examination after completed treatment for conditions other than malignant neoplasm: Secondary | ICD-10-CM | POA: Diagnosis not present

## 2022-07-23 DIAGNOSIS — Z87891 Personal history of nicotine dependence: Secondary | ICD-10-CM | POA: Diagnosis not present

## 2022-07-23 DIAGNOSIS — R3129 Other microscopic hematuria: Secondary | ICD-10-CM | POA: Insufficient documentation

## 2022-07-23 DIAGNOSIS — Z8546 Personal history of malignant neoplasm of prostate: Secondary | ICD-10-CM | POA: Insufficient documentation

## 2022-07-23 DIAGNOSIS — I10 Essential (primary) hypertension: Secondary | ICD-10-CM | POA: Diagnosis not present

## 2022-07-23 DIAGNOSIS — N433 Hydrocele, unspecified: Secondary | ICD-10-CM

## 2022-07-23 DIAGNOSIS — K219 Gastro-esophageal reflux disease without esophagitis: Secondary | ICD-10-CM | POA: Insufficient documentation

## 2022-07-23 DIAGNOSIS — E785 Hyperlipidemia, unspecified: Secondary | ICD-10-CM | POA: Diagnosis not present

## 2022-07-23 HISTORY — PX: HYDROCELE EXCISION: SHX482

## 2022-07-23 HISTORY — DX: Gastro-esophageal reflux disease without esophagitis: K21.9

## 2022-07-23 LAB — POCT I-STAT, CHEM 8
BUN: 22 mg/dL (ref 8–23)
Calcium, Ion: 1.24 mmol/L (ref 1.15–1.40)
Chloride: 105 mmol/L (ref 98–111)
Creatinine, Ser: 1.6 mg/dL — ABNORMAL HIGH (ref 0.61–1.24)
Glucose, Bld: 98 mg/dL (ref 70–99)
HCT: 42 % (ref 39.0–52.0)
Hemoglobin: 14.3 g/dL (ref 13.0–17.0)
Potassium: 4.1 mmol/L (ref 3.5–5.1)
Sodium: 139 mmol/L (ref 135–145)
TCO2: 24 mmol/L (ref 22–32)

## 2022-07-23 SURGERY — HYDROCELECTOMY
Anesthesia: General | Site: Scrotum | Laterality: Left

## 2022-07-23 MED ORDER — AMISULPRIDE (ANTIEMETIC) 5 MG/2ML IV SOLN: 10.0000 mg | Freq: Once | INTRAVENOUS | Status: DC | PRN

## 2022-07-23 MED ORDER — PROPOFOL 10 MG/ML IV BOLUS
INTRAVENOUS | Status: AC
  Filled 2022-07-23: qty 20

## 2022-07-23 MED ORDER — ACETAMINOPHEN 500 MG PO TABS
1000.0000 mg | ORAL_TABLET | Freq: Once | ORAL | Status: AC
  Administered 2022-07-23: 1000 mg via ORAL

## 2022-07-23 MED ORDER — FENTANYL CITRATE (PF) 100 MCG/2ML IJ SOLN
INTRAMUSCULAR | Status: DC | PRN
  Administered 2022-07-23 (×4): 25 ug via INTRAVENOUS

## 2022-07-23 MED ORDER — PROPOFOL 10 MG/ML IV BOLUS
INTRAVENOUS | Status: DC | PRN
  Administered 2022-07-23: 150 mg via INTRAVENOUS

## 2022-07-23 MED ORDER — LACTATED RINGERS IV SOLN: INTRAVENOUS | Status: DC

## 2022-07-23 MED ORDER — CEFAZOLIN SODIUM-DEXTROSE 2-4 GM/100ML-% IV SOLN
2.0000 g | Freq: Once | INTRAVENOUS | Status: AC
  Administered 2022-07-23: 2 g via INTRAVENOUS

## 2022-07-23 MED ORDER — OXYCODONE HCL 5 MG/5ML PO SOLN: 5.0000 mg | Freq: Once | ORAL | Status: DC | PRN

## 2022-07-23 MED ORDER — LIDOCAINE HCL (CARDIAC) PF 100 MG/5ML IV SOSY
PREFILLED_SYRINGE | INTRAVENOUS | Status: DC | PRN
  Administered 2022-07-23: 80 mg via INTRAVENOUS

## 2022-07-23 MED ORDER — SODIUM CHLORIDE 0.9% FLUSH: 3.0000 mL | Freq: Two times a day (BID) | INTRAVENOUS | Status: DC

## 2022-07-23 MED ORDER — FENTANYL CITRATE (PF) 100 MCG/2ML IJ SOLN: 25.0000 ug | INTRAMUSCULAR | Status: DC | PRN

## 2022-07-23 MED ORDER — ACETAMINOPHEN 500 MG PO TABS
ORAL_TABLET | ORAL | Status: AC
  Filled 2022-07-23: qty 2

## 2022-07-23 MED ORDER — ONDANSETRON HCL 4 MG/2ML IJ SOLN
INTRAMUSCULAR | Status: AC
  Filled 2022-07-23: qty 2

## 2022-07-23 MED ORDER — DEXAMETHASONE SODIUM PHOSPHATE 10 MG/ML IJ SOLN
INTRAMUSCULAR | Status: AC
  Filled 2022-07-23: qty 1

## 2022-07-23 MED ORDER — HYDROCODONE-ACETAMINOPHEN 5-325 MG PO TABS: 2.0000 | ORAL_TABLET | Freq: Four times a day (QID) | ORAL | 0 refills | Status: DC | PRN

## 2022-07-23 MED ORDER — SODIUM CHLORIDE 0.9 % IV SOLN: INTRAVENOUS | Status: DC

## 2022-07-23 MED ORDER — CEFAZOLIN SODIUM-DEXTROSE 2-4 GM/100ML-% IV SOLN
INTRAVENOUS | Status: AC
  Filled 2022-07-23: qty 100

## 2022-07-23 MED ORDER — MIDAZOLAM HCL 2 MG/2ML IJ SOLN
INTRAMUSCULAR | Status: AC
  Filled 2022-07-23: qty 2

## 2022-07-23 MED ORDER — GLYCOPYRROLATE 0.2 MG/ML IJ SOLN
INTRAMUSCULAR | Status: DC | PRN
  Administered 2022-07-23: .2 mg via INTRAVENOUS

## 2022-07-23 MED ORDER — OXYCODONE HCL 5 MG PO TABS: 5.0000 mg | ORAL_TABLET | Freq: Once | ORAL | Status: DC | PRN

## 2022-07-23 MED ORDER — MEPERIDINE HCL 25 MG/ML IJ SOLN: 6.2500 mg | INTRAMUSCULAR | Status: DC | PRN

## 2022-07-23 MED ORDER — PROMETHAZINE HCL 25 MG/ML IJ SOLN: 6.2500 mg | INTRAMUSCULAR | Status: DC | PRN

## 2022-07-23 MED ORDER — GLYCOPYRROLATE PF 0.2 MG/ML IJ SOSY
PREFILLED_SYRINGE | INTRAMUSCULAR | Status: AC
  Filled 2022-07-23: qty 1

## 2022-07-23 MED ORDER — ONDANSETRON HCL 4 MG/2ML IJ SOLN
INTRAMUSCULAR | Status: DC | PRN
  Administered 2022-07-23: 4 mg via INTRAVENOUS

## 2022-07-23 MED ORDER — MIDAZOLAM HCL 5 MG/5ML IJ SOLN
INTRAMUSCULAR | Status: DC | PRN
  Administered 2022-07-23: 2 mg via INTRAVENOUS

## 2022-07-23 MED ORDER — BUPIVACAINE HCL (PF) 0.25 % IJ SOLN
INTRAMUSCULAR | Status: DC | PRN
  Administered 2022-07-23: 7 mL

## 2022-07-23 MED ORDER — LIDOCAINE HCL (PF) 2 % IJ SOLN
INTRAMUSCULAR | Status: AC
  Filled 2022-07-23: qty 5

## 2022-07-23 MED ORDER — FENTANYL CITRATE (PF) 100 MCG/2ML IJ SOLN
INTRAMUSCULAR | Status: AC
  Filled 2022-07-23: qty 2

## 2022-07-23 MED ORDER — 0.9 % SODIUM CHLORIDE (POUR BTL) OPTIME
TOPICAL | Status: DC | PRN
  Administered 2022-07-23: 500 mL

## 2022-07-23 MED ORDER — DEXAMETHASONE SODIUM PHOSPHATE 4 MG/ML IJ SOLN
INTRAMUSCULAR | Status: DC | PRN
  Administered 2022-07-23: 5 mg via INTRAVENOUS

## 2022-07-23 SURGICAL SUPPLY — 34 items
BLADE CLIPPER SENSICLIP SURGIC (BLADE) ×1 IMPLANT
BLADE SURG 15 STRL LF DISP TIS (BLADE) ×1 IMPLANT
BLADE SURG 15 STRL SS (BLADE) ×1
BNDG GAUZE DERMACEA FLUFF 4 (GAUZE/BANDAGES/DRESSINGS) ×1 IMPLANT
BNDG GZE DERMACEA 4 6PLY (GAUZE/BANDAGES/DRESSINGS) ×1
CLEANER CAUTERY TIP PAD (MISCELLANEOUS) IMPLANT
COVER BACK TABLE 60X90IN (DRAPES) ×1 IMPLANT
COVER MAYO STAND STRL (DRAPES) ×1 IMPLANT
DISSECTOR ROUND CHERRY 3/8 STR (MISCELLANEOUS) IMPLANT
DRAIN PENROSE 0.25X18 (DRAIN) IMPLANT
DRAPE LAPAROTOMY 100X72 PEDS (DRAPES) ×1 IMPLANT
ELECT REM PT RETURN 9FT ADLT (ELECTROSURGICAL) ×1
ELECTRODE REM PT RTRN 9FT ADLT (ELECTROSURGICAL) IMPLANT
GAUZE 4X4 16PLY ~~LOC~~+RFID DBL (SPONGE) ×1 IMPLANT
GAUZE SPONGE 4X4 12PLY STRL (GAUZE/BANDAGES/DRESSINGS) IMPLANT
GLOVE BIOGEL PI IND STRL 7.0 (GLOVE) IMPLANT
GLOVE SURG SS PI 8.0 STRL IVOR (GLOVE) ×1 IMPLANT
GOWN STRL REUS W/TWL LRG LVL3 (GOWN DISPOSABLE) ×2 IMPLANT
GOWN STRL REUS W/TWL XL LVL3 (GOWN DISPOSABLE) IMPLANT
KIT TURNOVER CYSTO (KITS) ×1 IMPLANT
MANIFOLD NEPTUNE II (INSTRUMENTS) IMPLANT
NDL HYPO 25X1 1.5 SAFETY (NEEDLE) ×1 IMPLANT
NEEDLE HYPO 25X1 1.5 SAFETY (NEEDLE) ×1 IMPLANT
NS IRRIG 500ML POUR BTL (IV SOLUTION) ×1 IMPLANT
PACK BASIN DAY SURGERY FS (CUSTOM PROCEDURE TRAY) ×1 IMPLANT
PENCIL SMOKE EVACUATOR (MISCELLANEOUS) ×1 IMPLANT
SLEEVE SCD COMPRESS KNEE MED (STOCKING) ×1 IMPLANT
SUPPORT SCROTAL LG STRP (MISCELLANEOUS) ×1 IMPLANT
SUT CHROMIC 3 0 SH 27 (SUTURE) ×2 IMPLANT
SYR CONTROL 10ML LL (SYRINGE) ×1 IMPLANT
TOWEL OR 17X24 6PK STRL BLUE (TOWEL DISPOSABLE) ×2 IMPLANT
TRAY DSU PREP LF (CUSTOM PROCEDURE TRAY) ×1 IMPLANT
TUBE CONNECTING 12X1/4 (SUCTIONS) ×1 IMPLANT
YANKAUER SUCT BULB TIP NO VENT (SUCTIONS) ×1 IMPLANT

## 2022-07-23 NOTE — Transfer of Care (Signed)
Immediate Anesthesia Transfer of Care Note  Patient: Randy Barrera  Procedure(s) Performed: Procedure(s) (LRB): LEFT HYDROCELECTOMY ADULT (Left)  Patient Location: PACU  Anesthesia Type: General  Level of Consciousness: awake, sedated, patient cooperative and responds to stimulation  Airway & Oxygen Therapy: Patient Spontanous Breathing and Patient connected to [place] oxygen  Post-op Assessment: Report given to PACU RN, Post -op Vital signs reviewed and stable and Patient moving all extremities  Post vital signs: Reviewed and stable  Complications: No apparent anesthesia complications

## 2022-07-23 NOTE — Anesthesia Procedure Notes (Signed)
Procedure Name: LMA Insertion Date/Time: 07/23/2022 7:32 AM  Performed by: Jessica Priest, CRNAPre-anesthesia Checklist: Patient identified, Emergency Drugs available, Suction available, Patient being monitored and Timeout performed Patient Re-evaluated:Patient Re-evaluated prior to induction Oxygen Delivery Method: Circle system utilized Preoxygenation: Pre-oxygenation with 100% oxygen Induction Type: IV induction Ventilation: Mask ventilation without difficulty LMA: LMA inserted LMA Size: 4.0 Number of attempts: 1 Airway Equipment and Method: Bite block Placement Confirmation: positive ETCO2, breath sounds checked- equal and bilateral and CO2 detector Tube secured with: Tape Dental Injury: Teeth and Oropharynx as per pre-operative assessment

## 2022-07-23 NOTE — Op Note (Signed)
Procedure: Left hydrocelectomy.  Preop diagnosis: Left hydrocele.  Postop diagnosis: Same.  Surgeon: Dr. Bjorn Pippin.  Resident assistant: Dr. Michelle Piper.  Anesthesia: General.  Drain: 1/4 in penrose.  EBL: none.  Complications: none.  Indications: Randy Barrera is a 67 yo male who has a chronic symptomatic left hydrocele that he would like repaired.  Procedure: He was given 2gm of Ancef.  A general anesthetic was induced and he was placed with the supine postion and fitted with PAS hose.  His genitalia was clipped.  He was prepped with betadine solution and draped in the usual sterile fashion.  A left anterior oblique incision was made in the left scrotum with the knife.  The bovie was used to divide the dartos muscle exposing the hydrocele sac which was somewhat adherent.  Once the sac had been delivered from the scrotum it was opened with about 50-47ml of fluid drained.  The sac was moderately thick and there was insufficient redundant tissue to resect.  The sac was then imbricated posterior to the scrotum with a running locked 3-0 chromic suture.   A cord block was performed with 4ml of 0.25% marcaine and once hemostasis was assured, the testicle was returned to the scrotum and a 1/4in penrose was placed through a stab wound in the inferior scrotum.  It was temporarily secured with a towel clip.  The dartos was closed with a running 3-0 chromic with care taken to avoid the drain. The skin edges were infiltrated with an additional 4-93ml of marcaine and the skin was closed with a running vertical mattress 3-0 chromic.  The wound was cleansed and a dressing of 4x4's, fluffed kerlix and a scrotal support was applied.  His anesthetic was reversed and he was moved to the recovery room in stable condition.  There were no complications.

## 2022-07-23 NOTE — Discharge Instructions (Addendum)
You may remove the drain in the morning.  It should just slide out and if it doesn't come easily, please call the office so we can get you in to evaluated.     Replace the gauze after removing the drain.  Post Anesthesia Home Care Instructions  Activity: Get plenty of rest for the remainder of the day. A responsible adult should stay with you for 24 hours following the procedure.  For the next 24 hours, DO NOT: -Drive a car -Advertising copywriter -Drink alcoholic beverages -Take any medication unless instructed by your physician -Make any legal decisions or sign important papers.  Meals: Start with liquid foods such as gelatin or soup. Progress to regular foods as tolerated. Avoid greasy, spicy, heavy foods. If nausea and/or vomiting occur, drink only clear liquids until the nausea and/or vomiting subsides. Call your physician if vomiting continues.  Special Instructions/Symptoms: Your throat may feel dry or sore from the anesthesia or the breathing tube placed in your throat during surgery. If this causes discomfort, gargle with warm salt water. The discomfort should disappear within 24 hours.

## 2022-07-23 NOTE — Interval H&P Note (Signed)
History and Physical Interval Note:  07/23/2022 7:15 AM  Randy Barrera  has presented today for surgery, with the diagnosis of LEFT HYDROCELE.  The various methods of treatment have been discussed with the patient and family. After consideration of risks, benefits and other options for treatment, the patient has consented to  Procedure(s): LEFT HYDROCELECTOMY ADULT (Left) as a surgical intervention.  The patient's history has been reviewed, patient examined, no change in status, stable for surgery.  I have reviewed the patient's chart and labs.  Questions were answered to the patient's satisfaction.     Bjorn Pippin

## 2022-07-24 ENCOUNTER — Encounter (HOSPITAL_BASED_OUTPATIENT_CLINIC_OR_DEPARTMENT_OTHER): Payer: Self-pay | Admitting: Urology

## 2022-07-24 NOTE — Anesthesia Postprocedure Evaluation (Signed)
Anesthesia Post Note  Patient: Randy Barrera  Procedure(s) Performed: LEFT HYDROCELECTOMY ADULT (Left: Scrotum)     Patient location during evaluation: PACU Anesthesia Type: General Level of consciousness: sedated and patient cooperative Pain management: pain level controlled Vital Signs Assessment: post-procedure vital signs reviewed and stable Respiratory status: spontaneous breathing Cardiovascular status: stable Anesthetic complications: no   No notable events documented.  Last Vitals:  Vitals:   07/23/22 0913 07/23/22 0951  BP:  130/87  Pulse: (!) 45 (!) 58  Resp: 14 16  Temp: (!) 36.4 C   SpO2: 95% 96%    Last Pain:  Vitals:   07/23/22 0951  TempSrc:   PainSc: 1                  Lewie Loron

## 2022-07-29 ENCOUNTER — Ambulatory Visit (INDEPENDENT_AMBULATORY_CARE_PROVIDER_SITE_OTHER): Payer: Medicare Other

## 2022-07-29 ENCOUNTER — Ambulatory Visit: Payer: Medicare Other | Admitting: Family Medicine

## 2022-07-29 ENCOUNTER — Telehealth: Payer: Self-pay

## 2022-07-29 ENCOUNTER — Other Ambulatory Visit: Payer: Self-pay

## 2022-07-29 VITALS — BP 132/88 | HR 62 | Ht 71.5 in | Wt 184.0 lb

## 2022-07-29 DIAGNOSIS — M25561 Pain in right knee: Secondary | ICD-10-CM

## 2022-07-29 DIAGNOSIS — M4856XA Collapsed vertebra, not elsewhere classified, lumbar region, initial encounter for fracture: Secondary | ICD-10-CM | POA: Diagnosis not present

## 2022-07-29 DIAGNOSIS — M5127 Other intervertebral disc displacement, lumbosacral region: Secondary | ICD-10-CM | POA: Diagnosis not present

## 2022-07-29 DIAGNOSIS — G8929 Other chronic pain: Secondary | ICD-10-CM

## 2022-07-29 DIAGNOSIS — M545 Low back pain, unspecified: Secondary | ICD-10-CM | POA: Diagnosis not present

## 2022-07-29 DIAGNOSIS — M47816 Spondylosis without myelopathy or radiculopathy, lumbar region: Secondary | ICD-10-CM | POA: Diagnosis not present

## 2022-07-29 NOTE — Progress Notes (Signed)
Randy Payor, PhD, LAT, ATC acting as a scribe for Randy Graham, MD.  Randy Barrera is a 67 y.o. male who presents to [facility] at [facility] today for R knee and low back pain. He plays a lot of golf and is very active.  R knee pain x about 1 year. He was seen previously at [facility] for this issue. He notes prior imaging. Pt locates pain to the anterior-lateral aspect o the R knee and into the proximal-lateral lower leg.  Radiates: yes- to anterior R shin R Knee swelling: no Mechanical symptoms: yes Aggravates: after activity,  Treatments tried: ice, Voltaren gel, Tylenol, prior PT  Pt also c/o LBP ongoing for years. Pt locates pain to the R-side of his low back. He note stiffness in the low back.   Radiating pain: no- unsure if related to pain radiating into his R anterior shin LE numbness/tingling: no LE weakness: no Aggravates: activity, golf, walking Treatments tried: Tylenol He does note some mechanical symptoms.  We plays golf he will have some locking or catching sensations occasionally.  Pertinent review of systems: No fevers or chills  Relevant historical information: CKD.  History of prostate cancer.   Exam:  BP 132/88   Pulse 62   Ht 5' 11.5" (1.816 m)   Wt 184 lb (83.5 kg)   SpO2 97%   BMI 25.31 kg/m  General: Well Developed, well nourished, and in no acute distress.   MSK: Right knee: Normal-appearing Nontender to palpation.  Normal motion.  Stable ligamentous exam.  Intact strength. Negative Murray's test.  L-spine: Normal appearing tender normal motion.  Lower EXTR strength is intact.    Lab and Radiology Results  Diagnostic Limited MSK Ultrasound of: Right knee Quad tendon intact normal. Patellar tendon normal. Lateral joint line degenerative appearing lateral meniscus. Medial joint line normal appearing medial meniscus. Impression: Probable lateral meniscus tear   X-ray images lumbar spine obtained today personally and  independently interpreted Multilevel DDD.  No acute fractures. Await formal radiology review  EXAM: MRI OF THE RIGHT KNEE WITHOUT CONTRAST   TECHNIQUE: Multiplanar, multisequence MR imaging of the knee was performed. No intravenous contrast was administered.   COMPARISON:  X-ray 07/18/2021   FINDINGS: MENISCI   Medial meniscus: Intrasubstance degeneration without a well-defined tear.   Lateral meniscus: Horizontally oriented linear high signal within the lateral meniscal anterior horn and body. Signal closely approximates the articular surface at the free edge of the body. Findings are concerning for a nondisplaced horizontal meniscal tear.   LIGAMENTS   Cruciates: Intact ACL and PCL.   Collaterals: Intact MCL. Lateral collateral ligament complex intact.   CARTILAGE   Patellofemoral:  No chondral defect.   Medial: Mild chondral surface irregularity of the weight-bearing medial femoral condyle.   Lateral:  No chondral defect.   MISCELLANEOUS   Joint:  No joint effusion. Fat pads within normal limits.   Popliteal Fossa:  No Baker's cyst. Intact popliteus tendon.   Extensor Mechanism:  Intact quadriceps and patellar tendons.   Bones: No acute fracture. No dislocation. No bone marrow edema. No marrow replacing bone lesion.   Other: Periarticular soft tissue edema at the lateral aspect of the knee.   IMPRESSION: 1. Appearance suspicious for a horizontal tear of the lateral meniscus. 2. Intrasubstance degeneration of the medial meniscus. 3. Mild chondral irregularity of the medial compartment. 4. Periarticular soft tissue edema at the lateral aspect of the knee.     Electronically Signed  By: Duanne Guess D.O.   On: 11/07/2021 12:26 I, Randy Barrera, personally (independently) visualized and performed the interpretation of the images attached in this note.   Assessment and Plan: 67 y.o. male with right knee pain thought to be probably due to the  lateral meniscus tear seen on MRI last year.  He is already had a trial of physical therapy and steroid injection with little benefit.  Next step from here would be trial of hyaluronic acid injection and/or Zilretta injection and if that does not work a consultation with orthopedic surgery.  Differential does include lumbar radiculopathy but this is less likely based on symptoms.  Consider MRI lumbar spine in the future if needed.  PDMP not reviewed this encounter. Orders Placed This Encounter  Procedures   DG Lumbar Spine 2-3 Views    Standing Status:   Future    Number of Occurrences:   1    Standing Expiration Date:   08/29/2022    Order Specific Question:   Reason for Exam (SYMPTOM  OR DIAGNOSIS REQUIRED)    Answer:   low back pain    Order Specific Question:   Preferred imaging location?    Answer:   [facility]   [place] LIMITED JOINT SPACE STRUCTURES LOW RIGHT(NO LINKED CHARGES)    Order Specific Question:   Reason for Exam (SYMPTOM  OR DIAGNOSIS REQUIRED)    Answer:   right knee pain    Order Specific Question:   Preferred imaging location?    Answer:   [facility]   No orders of the defined types were placed in this encounter.    Discussed warning signs or symptoms. Please see discharge instructions. Patient expresses understanding.   The above documentation has been reviewed and is accurate and complete Randy Barrera, M.D.

## 2022-07-29 NOTE — Telephone Encounter (Signed)
VOB initiated for GELSYN for RIGHT knee OA.  (Also checking Zilretta)

## 2022-07-29 NOTE — Patient Instructions (Addendum)
Thank you for coming in today.   Plan to proceed with x-ray lumbar spine today.  We will work on getting gel shots and Zilretta shots ready.  You should hear from [place] soon.

## 2022-07-29 NOTE — Telephone Encounter (Signed)
Nobie Putnam, [place], [place] Please Mcarthur Rossetti and Zilretta for R knee.

## 2022-07-29 NOTE — Telephone Encounter (Signed)
VOB initiated for Zilretta for RIGHT knee OA.  

## 2022-07-30 NOTE — Telephone Encounter (Signed)
VOB complete

## 2022-07-30 NOTE — Telephone Encounter (Signed)
Zilretta for RIGHT knee OA (Also checking Gelsyn)  Primary insurance: [employer] Medicare Adv Co-pay: $20 Co-insurance: 20% Deductible: does not apply Prior Auth NOT required

## 2022-07-30 NOTE — Telephone Encounter (Addendum)
GELSYN for BILAT knee OA (Also checking Zilretta)  Primary Insurance: [employer] Medicare Adv Co-pay: $20 Co-insurance: 20% Deductible: does not apply Prior Auth NOT required   New start

## 2022-07-30 NOTE — Telephone Encounter (Signed)
VOB completed

## 2022-07-31 NOTE — Telephone Encounter (Signed)
Left message for patient to call back to schedule.  °

## 2022-08-01 DIAGNOSIS — C44519 Basal cell carcinoma of skin of other part of trunk: Secondary | ICD-10-CM | POA: Diagnosis not present

## 2022-08-02 ENCOUNTER — Other Ambulatory Visit: Payer: Self-pay

## 2022-08-02 ENCOUNTER — Ambulatory Visit: Payer: Medicare Other | Admitting: Family Medicine

## 2022-08-02 VITALS — BP 132/88 | HR 62 | Ht 71.5 in | Wt 184.0 lb

## 2022-08-02 DIAGNOSIS — M1711 Unilateral primary osteoarthritis, right knee: Secondary | ICD-10-CM | POA: Diagnosis not present

## 2022-08-02 DIAGNOSIS — G8929 Other chronic pain: Secondary | ICD-10-CM | POA: Diagnosis not present

## 2022-08-02 DIAGNOSIS — M25561 Pain in right knee: Secondary | ICD-10-CM

## 2022-08-02 MED ORDER — TRIAMCINOLONE ACETONIDE 32 MG IX SRER
32.0000 mg | Freq: Once | INTRA_ARTICULAR | Status: AC
Start: 2022-08-02 — End: 2022-08-02
  Administered 2022-08-02: 32 mg via INTRA_ARTICULAR

## 2022-08-02 NOTE — Progress Notes (Signed)
  Zilretta injection right knee Procedure: Real-time Ultrasound Guided Injection of Right knee joint superior lateral patellar space Device: Philips Affiniti 50G Images permanently stored and available for review in PACS Verbal informed consent obtained.  Discussed risks and benefits of procedure. Warned about infection, hyperglycemia bleeding, damage to structures among others. Patient expresses understanding and agreement Time-out conducted.   Noted no overlying erythema, induration, or other signs of local infection.   Skin prepped in a sterile fashion.   Local anesthesia: Topical Ethyl chloride.   With sterile technique and under real time ultrasound guidance: Zilretta 32 mg injected into knee joint. Fluid seen entering the joint capsule.   Completed without difficulty   Advised to call if fevers/chills, erythema, induration, drainage, or persistent bleeding.   Images permanently stored and available for review in the ultrasound unit.  Impression: Technically successful ultrasound guided injection.  Lot number: [id number]  If not better next step would be HA gel injection or MRI

## 2022-08-02 NOTE — Patient Instructions (Addendum)
Thank you for coming in today.  You received an injection today. Seek immediate medical attention if the joint becomes red, extremely painful, or is oozing fluid.  If this is not effective next step is Gel shots or MRI.   Let me know if you have any problems.

## 2022-08-05 NOTE — Progress Notes (Signed)
Lumbar spine x-ray shows arthritis worse at the base of the spine.

## 2022-08-05 NOTE — Telephone Encounter (Signed)
Pt received Zilretta inj for RIGHT knee OA on 08/02/22.   Can consider repeat on or after 10/26/2022.

## 2022-08-06 NOTE — Telephone Encounter (Signed)
Pt received Zilretta inj for RIGHT knee OA 08/02/22. Can consider repeat inj on or after 10/26/22

## 2022-08-19 ENCOUNTER — Encounter: Payer: Self-pay | Admitting: Family Medicine

## 2022-08-19 ENCOUNTER — Other Ambulatory Visit: Payer: Self-pay

## 2022-08-19 DIAGNOSIS — M545 Low back pain, unspecified: Secondary | ICD-10-CM

## 2022-08-26 ENCOUNTER — Ambulatory Visit (INDEPENDENT_AMBULATORY_CARE_PROVIDER_SITE_OTHER): Payer: Medicare Other

## 2022-08-26 DIAGNOSIS — M48061 Spinal stenosis, lumbar region without neurogenic claudication: Secondary | ICD-10-CM | POA: Diagnosis not present

## 2022-08-26 DIAGNOSIS — M545 Low back pain, unspecified: Secondary | ICD-10-CM | POA: Diagnosis not present

## 2022-08-26 DIAGNOSIS — M5126 Other intervertebral disc displacement, lumbar region: Secondary | ICD-10-CM | POA: Diagnosis not present

## 2022-08-26 DIAGNOSIS — M5137 Other intervertebral disc degeneration, lumbosacral region: Secondary | ICD-10-CM | POA: Diagnosis not present

## 2022-08-26 DIAGNOSIS — G8929 Other chronic pain: Secondary | ICD-10-CM | POA: Diagnosis not present

## 2022-08-26 DIAGNOSIS — M5136 Other intervertebral disc degeneration, lumbar region: Secondary | ICD-10-CM | POA: Diagnosis not present

## 2022-09-02 ENCOUNTER — Ambulatory Visit: Payer: Medicare Other | Attending: Internal Medicine

## 2022-09-02 DIAGNOSIS — I2510 Atherosclerotic heart disease of native coronary artery without angina pectoris: Secondary | ICD-10-CM | POA: Diagnosis not present

## 2022-09-02 DIAGNOSIS — I700 Atherosclerosis of aorta: Secondary | ICD-10-CM

## 2022-09-02 DIAGNOSIS — I2584 Coronary atherosclerosis due to calcified coronary lesion: Secondary | ICD-10-CM | POA: Diagnosis not present

## 2022-09-03 LAB — ALT: ALT: 14 IU/L (ref 0–44)

## 2022-09-03 LAB — LIPID PANEL
Chol/HDL Ratio: 2.1 ratio (ref 0.0–5.0)
Cholesterol, Total: 156 mg/dL (ref 100–199)
HDL: 73 mg/dL (ref >39)
LDL Chol Calc (NIH): 70 mg/dL (ref 0–99)
Triglycerides: 64 mg/dL (ref 0–149)
VLDL Cholesterol Cal: 13 mg/dL (ref 5–40)

## 2022-09-04 ENCOUNTER — Telehealth: Payer: Self-pay

## 2022-09-04 DIAGNOSIS — I700 Atherosclerosis of aorta: Secondary | ICD-10-CM

## 2022-09-04 DIAGNOSIS — I2510 Atherosclerotic heart disease of native coronary artery without angina pectoris: Secondary | ICD-10-CM

## 2022-09-04 MED ORDER — EZETIMIBE 10 MG PO TABS: 10.0000 mg | ORAL_TABLET | Freq: Every day | ORAL | 3 refills | Status: DC

## 2022-09-04 NOTE — Telephone Encounter (Signed)
-----   Message from Christell Constant sent at 09/03/2022  1:23 PM EDT ----- Results: Continued improvement in cholesterol Plan: At last visit, we had set an aggressive LDL goal for heart attack prevention; zetia 10 and labs in three months including hsCRP  Christell Constant, MD

## 2022-09-04 NOTE — Telephone Encounter (Signed)
The patient has been notified of the result and verbalized understanding.  All questions (if any) were answered. Randy Burows, RN 09/04/2022 2:27 PM    Pt will come in for f/u labs on 12/02/22.

## 2022-09-09 NOTE — Progress Notes (Signed)
MRI shows several areas that could cause pinched nerve or back pain.  You do have an odd cystic structure at the L1 level that looks stable compared to a CT scan from 2022.  I am not sure if this could be causing any pain or not.  Recommend return to clinic to go over the results in full detail and discussed treatment plan and options including possibly injection.

## 2022-09-12 ENCOUNTER — Ambulatory Visit: Payer: Medicare Other | Admitting: Family Medicine

## 2022-09-12 ENCOUNTER — Encounter: Payer: Self-pay | Admitting: Family Medicine

## 2022-09-12 VITALS — BP 122/82 | HR 55 | Ht 71.5 in | Wt 185.0 lb

## 2022-09-12 DIAGNOSIS — M47816 Spondylosis without myelopathy or radiculopathy, lumbar region: Secondary | ICD-10-CM | POA: Diagnosis not present

## 2022-09-12 DIAGNOSIS — G8929 Other chronic pain: Secondary | ICD-10-CM

## 2022-09-12 DIAGNOSIS — M545 Low back pain, unspecified: Secondary | ICD-10-CM

## 2022-09-12 NOTE — Progress Notes (Signed)
I, Stevenson Clinch, CMA acting as a scribe for [name], MD.  Randy Barrera is a 67 y.o. male who presents to [facility] at [facility] today for f/u LBP w/ MRI review. Pt was last seen by Dr. Denyse Amass on 08/02/22 for a Zilretta injection in his R knee. In early Aug, pt exchanged MyChart messages about his LBP and a MRI was ordered.  Today, pt reports continued lower back pain. Pt locates pain to right side lower back, occasionally left-sided. Denies n/t/w. Denies radicular sx. Denies bowel bladder dysfunction.   Dx imaging: 08/26/22 L-spine MRI  07/29/22 L-spine XR  Pertinent review of systems: No fevers or chills  Relevant historical information: History of prostate cancer   Exam:  BP 122/82   Pulse (!) 55   Ht 5' 11.5" (1.816 m)   Wt 185 lb (83.9 kg)   SpO2 97%   BMI 25.44 kg/m  General: Well Developed, well nourished, and in no acute distress.   MSK: L-spine normal appearing. Nontender palpation spinal midline.  Tender palpation mildly right lumbar paraspinal musculature. Normal lumbar motion.    Lab and Radiology Results  EXAM: MRI LUMBAR SPINE WITHOUT CONTRAST   TECHNIQUE: Multiplanar, multisequence MR imaging of the lumbar spine was performed. No intravenous contrast was administered.   COMPARISON:  CT scan 10/04/2020   FINDINGS: Segmentation: There are five lumbar type vertebral bodies. The last full intervertebral disc space is labeled L5-S1.   Alignment:  Normal   Vertebrae:  Normal marrow signal.  No bone lesions or fractures.   Conus medullaris and cauda equina: Conus extends to the L1 level. Conus and cauda equina appear normal.   Paraspinal and other soft tissues: No significant paraspinal or retroperitoneal findings.   Disc levels:   T12-L1: Mild bulging disc a disc but no spinal or foraminal stenosis. There is a cystic structure on the right side at the mid L1 level below the neural foramen with mild pressure erosion in  the right lamina/pedicle area this measures a maximum of 13 mm and is likely perineural cyst or dural rent. No change when compared to the prior CT scan from 2022.   L1-2: Mild facet disease but no disc protrusions, spinal or foraminal stenosis.   L2-3: Mild annular bulge and mild facet disease contributing to mild bilateral lateral recess encroachment but no significant spinal or foraminal stenosis.   L3-4: Diffuse annular bulge, moderate facet disease and ligamentum flavum thickening contributing to mild/early spinal stenosis and mild to moderate bilateral lateral recess stenosis. Mild foraminal encroachment bilaterally also.   L4-5: Diffuse annular bulge, facet disease and ligamentum flavum thickening contributing to mild to moderate spinal and bilateral lateral recess stenosis. Mild foraminal encroachment bilaterally also.   L5-S1: Advanced degenerative disc disease with marked disc space narrowing and osteophytic spurring. This in conjunction with facet disease contributes to mild to moderate spinal and left lateral recess stenosis. No significant foraminal stenosis.   IMPRESSION: 1. Mild bilateral lateral recess encroachment at L2-3. 2. Mild/early spinal stenosis and mild to moderate bilateral lateral recess stenosis at L3-4. Mild foraminal encroachment bilaterally also. 3. Mild to moderate spinal and bilateral lateral recess stenosis at L4-5. Mild foraminal encroachment bilaterally also. 4. Mild to moderate spinal and left lateral recess stenosis at L5-S1. 5. 13 mm cystic structure on the right side at the mid L1 level below the neural foramen with mild pressure erosion in the right lamina/pedicle area. 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  No change when compared to the prior CT scan from 2022.     Electronically Signed   By: Rudie Meyer M.D.   On: 09/08/2022 07:35 I, [name], personally (independently) visualized and performed the  interpretation of the images attached in this note.      Assessment and Plan: 67 y.o. male with chronic right low back pain without radiation.  Patient has multiple things that could produce pain on his recent lumbar spine MRI.  The most likely candidate for pain generator based on his location of pain in his right low back and the MRI are of the facet joints at the bottom of the right side of the spine L4-5 and L5-S1.  Plan for facet injection at these levels.  If not improved consider an epidural steroid injection or even a nerve root block.   PDMP not reviewed this encounter. Orders Placed This Encounter  Procedures   DG FACET JT INJ L /S SINGLE LEVEL RIGHT W/FL/CT    Standing Status:   Future    Standing Expiration Date:   09/12/2023    Order Specific Question:   Reason for Exam (SYMPTOM  OR DIAGNOSIS REQUIRED)    Answer:   Rt L4-5 and L5-S1 inj    Order Specific Question:   Preferred Imaging Location?    Answer:   [facility]    Order Specific Question:   Radiology Contrast Protocol - do NOT remove file path    Answer:   [email or web address]   DG FACET JT INJ L /S 2ND LEVEL RIGHT W/FL/CT    Standing Status:   Future    Standing Expiration Date:   09/12/2023    Order Specific Question:   Reason for Exam (SYMPTOM  OR DIAGNOSIS REQUIRED)    Answer:   Rt L4-5 and L5-S1 inj    Order Specific Question:   Preferred Imaging Location?    Answer:   [facility]    Order Specific Question:   Radiology Contrast Protocol - do NOT remove file path    Answer:   \\[email or web address]   No orders of the defined types were placed in this encounter.    Discussed warning signs or symptoms. Please see discharge instructions. Patient expresses understanding.   The above documentation has been reviewed and is accurate and complete [name], M.D. Total encounter time 30 minutes including face-to-face time  with the patient and, reviewing past medical record, and charting on the date of service.

## 2022-09-12 NOTE — Patient Instructions (Addendum)
Thank you for coming in today.   Please call [facility] ([facility]) at [phone] to schedule your spine injection.

## 2022-09-25 NOTE — Discharge Instructions (Signed)

## 2022-09-26 ENCOUNTER — Ambulatory Visit
Admission: RE | Admit: 2022-09-26 | Discharge: 2022-09-26 | Disposition: A | Discharge status: Home / Self Care | Payer: Medicare Other | Source: Ambulatory Visit | Attending: Family Medicine | Admitting: Family Medicine

## 2022-09-26 DIAGNOSIS — M47816 Spondylosis without myelopathy or radiculopathy, lumbar region: Secondary | ICD-10-CM | POA: Diagnosis not present

## 2022-09-26 DIAGNOSIS — M545 Low back pain, unspecified: Secondary | ICD-10-CM

## 2022-09-26 MED ORDER — METHYLPREDNISOLONE ACETATE 40 MG/ML INJ SUSP (RADIOLOG
80.0000 mg | Freq: Once | INTRAMUSCULAR | Status: AC
  Administered 2022-09-26: 80 mg via INTRA_ARTICULAR

## 2022-09-26 MED ORDER — IOPAMIDOL (ISOVUE-M 200) INJECTION 41%
1.0000 mL | Freq: Once | INTRAMUSCULAR | Status: AC
  Administered 2022-09-26: 1 mL via INTRA_ARTICULAR

## 2022-09-30 NOTE — Telephone Encounter (Signed)
VOB initiated for ZILRETTA for RIGHT knee OA.

## 2022-10-01 NOTE — Telephone Encounter (Signed)
Hold until needed

## 2022-10-01 NOTE — Telephone Encounter (Signed)
NO Prior Auth required for [employer]

## 2022-10-01 NOTE — Telephone Encounter (Signed)
Zilretta for RIGHT knee OA  OK to schedule on or after 10/26/22  Primary Insurance: [employer]HC Medicare Adv HMO-POS Co-pay: $20 Co-insurance: 20% Deductible: does not apply Prior Auth: NOT required   Knee Injection History 08/02/22 - Zilretta RIGHT

## 2022-11-13 NOTE — Progress Notes (Unsigned)
   Rubin Payor, PhD, LAT, ATC acting as a scribe for [name], MD.  Randy Barrera is a 67 y.o. male who presents to [facility] at [facility] today for cont'd LBP. Pt was last seen by Dr. Denyse Amass on 09/12/22 and was facet injections were ordered, later given on 09/26/22.  Today, pt reports ***  Dx imaging: 08/26/22 L-spine MRI             07/29/22 L-spine XR  Pertinent review of systems: ***  Relevant historical information: ***   Exam:  There were no vitals taken for this visit. General: Well Developed, well nourished, and in no acute distress.   MSK: ***    Lab and Radiology Results No results found for this or any previous visit (from the past 72 hour(s)). No results found.     Assessment and Plan: 67 y.o. male with ***   PDMP not reviewed this encounter. No orders of the defined types were placed in this encounter.  No orders of the defined types were placed in this encounter.    Discussed warning signs or symptoms. Please see discharge instructions. Patient expresses understanding.   ***

## 2022-11-14 ENCOUNTER — Ambulatory Visit: Payer: Medicare Other | Admitting: Family Medicine

## 2022-11-14 ENCOUNTER — Encounter: Payer: Self-pay | Admitting: Family Medicine

## 2022-11-14 VITALS — BP 128/82 | HR 50 | Ht 71.5 in | Wt 185.0 lb

## 2022-11-14 DIAGNOSIS — M545 Low back pain, unspecified: Secondary | ICD-10-CM

## 2022-11-14 DIAGNOSIS — G8929 Other chronic pain: Secondary | ICD-10-CM | POA: Diagnosis not present

## 2022-11-14 DIAGNOSIS — M47816 Spondylosis without myelopathy or radiculopathy, lumbar region: Secondary | ICD-10-CM | POA: Diagnosis not present

## 2022-11-14 NOTE — Patient Instructions (Addendum)
Thank you for coming in today.   Please call [facility] ([facility]) at [phone] to schedule your spine injection.    If this does not work well let me know. I will likely get a second opinion from Dr Lorrine Kin.

## 2022-12-02 ENCOUNTER — Other Ambulatory Visit: Payer: Medicare Other

## 2022-12-02 DIAGNOSIS — I700 Atherosclerosis of aorta: Secondary | ICD-10-CM

## 2022-12-02 DIAGNOSIS — I2510 Atherosclerotic heart disease of native coronary artery without angina pectoris: Secondary | ICD-10-CM

## 2022-12-06 ENCOUNTER — Ambulatory Visit: Payer: Medicare Other

## 2022-12-17 ENCOUNTER — Telehealth: Payer: Self-pay

## 2022-12-17 NOTE — Telephone Encounter (Signed)
Pt self-scheduled. I Called to check what kind of knee injection he was expecting, as last injection in July was Zilretta. He was hoping to repeat Zilretta tomorrow. Do we have any in-stock for him? If not please call and r/s.

## 2022-12-17 NOTE — Telephone Encounter (Signed)
We will be good to proceed with Zilretta at visit scheduled.

## 2022-12-17 NOTE — Progress Notes (Unsigned)
   Rubin Payor, PhD, LAT, ATC acting as a scribe for [name], MD.  Erskin Fedder is a 67 y.o. male who presents to [facility] at [facility] today for exacerbation of his R knee pain. Pt was last seen by Dr. Denyse Amass for his R knee on 08/02/22 and was given a Zilretta injection.  Today, pt reports ***  Pertinent review of systems: ***  Relevant historical information: ***   Exam:  There were no vitals taken for this visit. General: Well Developed, well nourished, and in no acute distress.   MSK: ***    Lab and Radiology Results No results found for this or any previous visit (from the past 72 hour(s)). No results found.     Assessment and Plan: 67 y.o. male with ***   PDMP not reviewed this encounter. No orders of the defined types were placed in this encounter.  No orders of the defined types were placed in this encounter.    Discussed warning signs or symptoms. Please see discharge instructions. Patient expresses understanding.   ***

## 2022-12-18 ENCOUNTER — Other Ambulatory Visit: Payer: Self-pay

## 2022-12-18 ENCOUNTER — Ambulatory Visit: Payer: Medicare Other | Admitting: Family Medicine

## 2022-12-18 ENCOUNTER — Encounter: Payer: Self-pay | Admitting: Family Medicine

## 2022-12-18 VITALS — BP 138/88 | HR 44 | Ht 71.5 in | Wt 188.0 lb

## 2022-12-18 DIAGNOSIS — Z524 Kidney donor: Secondary | ICD-10-CM | POA: Diagnosis not present

## 2022-12-18 DIAGNOSIS — G8929 Other chronic pain: Secondary | ICD-10-CM

## 2022-12-18 DIAGNOSIS — M1711 Unilateral primary osteoarthritis, right knee: Secondary | ICD-10-CM | POA: Diagnosis not present

## 2022-12-18 DIAGNOSIS — M25561 Pain in right knee: Secondary | ICD-10-CM

## 2022-12-18 MED ORDER — TRIAMCINOLONE ACETONIDE 32 MG IX SRER
32.0000 mg | Freq: Once | INTRA_ARTICULAR | Status: AC
Start: 2022-12-18 — End: 2022-12-18
  Administered 2022-12-18: 32 mg via INTRA_ARTICULAR

## 2022-12-18 NOTE — Patient Instructions (Addendum)
Thank you for coming in today.  You received an injection today. Seek immediate medical attention if the joint becomes red, extremely painful, or is oozing fluid.  We can do this again in 3 months if needed.

## 2022-12-20 ENCOUNTER — Other Ambulatory Visit: Payer: Self-pay | Admitting: Family Medicine

## 2022-12-20 DIAGNOSIS — M47816 Spondylosis without myelopathy or radiculopathy, lumbar region: Secondary | ICD-10-CM

## 2022-12-20 DIAGNOSIS — G8929 Other chronic pain: Secondary | ICD-10-CM

## 2022-12-25 ENCOUNTER — Other Ambulatory Visit: Payer: Medicare Other

## 2022-12-25 DIAGNOSIS — I700 Atherosclerosis of aorta: Secondary | ICD-10-CM | POA: Diagnosis not present

## 2022-12-25 DIAGNOSIS — I2510 Atherosclerotic heart disease of native coronary artery without angina pectoris: Secondary | ICD-10-CM | POA: Diagnosis not present

## 2022-12-26 LAB — LIPID PANEL
Chol/HDL Ratio: 1.8 {ratio} (ref 0.0–5.0)
Cholesterol, Total: 146 mg/dL (ref 100–199)
HDL: 82 mg/dL (ref >39)
LDL Chol Calc (NIH): 55 mg/dL (ref 0–99)
Triglycerides: 37 mg/dL (ref 0–149)
VLDL Cholesterol Cal: 9 mg/dL (ref 5–40)

## 2022-12-26 LAB — HIGH SENSITIVITY CRP: CRP, High Sensitivity: 0.49 mg/L (ref 0.00–3.00)

## 2022-12-26 LAB — ALT: ALT: 21 [IU]/L (ref 0–44)

## 2022-12-26 NOTE — Discharge Instructions (Addendum)
Medial Branch Block Discharge Instructions ? ?Take over-the-counter and prescription medicines only as told by your health care provider. ? ?Do not drive the day of your procedure ? ?Return to your normal activities as told by your health care provider.  ? ?If injection site is sore you may ice the area for 20 minutes, 2-3 times a day.  ? ?Check your injection site every day for signs of infection. Check for: ?Redness, swelling, or pain. ?Fluid or blood. ?Warmth. ?Pus or a bad smell. ? ?Please contact our office at [phone] if: ?You have a fever or chills. ?You have any signs of infection. ?You develop any numbness or weakness. ? ? ?Thank you for visiting our office.   ? ?YOU MAY RESUME YOUR ASPIRIN ANYTIME AFTER PROCEDURE TODAY  ?

## 2022-12-27 ENCOUNTER — Other Ambulatory Visit: Payer: Self-pay | Admitting: Family Medicine

## 2022-12-27 ENCOUNTER — Ambulatory Visit
Admission: RE | Admit: 2022-12-27 | Discharge: 2022-12-27 | Disposition: A | Discharge status: Home / Self Care | Payer: Medicare Other | Source: Ambulatory Visit | Attending: Family Medicine | Admitting: Family Medicine

## 2022-12-27 DIAGNOSIS — M545 Low back pain, unspecified: Secondary | ICD-10-CM

## 2022-12-27 DIAGNOSIS — M47816 Spondylosis without myelopathy or radiculopathy, lumbar region: Secondary | ICD-10-CM

## 2022-12-30 ENCOUNTER — Encounter: Payer: Self-pay | Admitting: Family Medicine

## 2022-12-30 NOTE — Telephone Encounter (Signed)
Forwarding to Dr. Georgina Snell as Juluis Rainier.

## 2022-12-30 NOTE — Progress Notes (Signed)
When the radiologist called you back after doing the injection you said it did not feel any better.  Please clarify did it not feel better right after the injection and when he called you the injection had worn off or the injection did not help at all even temporarily?

## 2023-01-13 NOTE — Telephone Encounter (Signed)
Last Zilretta inj 12/18/22 Can consider repeat inj on or after 03/13/23

## 2023-01-24 DIAGNOSIS — H43393 Other vitreous opacities, bilateral: Secondary | ICD-10-CM | POA: Diagnosis not present

## 2023-01-24 DIAGNOSIS — H0100B Unspecified blepharitis left eye, upper and lower eyelids: Secondary | ICD-10-CM | POA: Diagnosis not present

## 2023-01-31 ENCOUNTER — Ambulatory Visit
Admission: EM | Admit: 2023-01-31 | Discharge: 2023-01-31 | Disposition: A | Discharge status: Home / Self Care | Payer: Medicare Other | Attending: Internal Medicine | Admitting: Internal Medicine

## 2023-01-31 ENCOUNTER — Encounter: Payer: Self-pay | Admitting: Emergency Medicine

## 2023-01-31 DIAGNOSIS — L239 Allergic contact dermatitis, unspecified cause: Secondary | ICD-10-CM | POA: Diagnosis not present

## 2023-01-31 MED ORDER — METHYLPREDNISOLONE ACETATE 40 MG/ML IJ SUSP: 40.0000 mg | Freq: Once | INTRAMUSCULAR | Status: DC

## 2023-01-31 MED ORDER — METHYLPREDNISOLONE ACETATE 80 MG/ML IJ SUSP
40.0000 mg | Freq: Once | INTRAMUSCULAR | Status: AC
  Administered 2023-01-31: 40 mg via INTRAMUSCULAR

## 2023-01-31 MED ORDER — PREDNISONE 20 MG PO TABS: 40.0000 mg | ORAL_TABLET | Freq: Every day | ORAL | 0 refills | Status: AC

## 2023-01-31 NOTE — ED Provider Notes (Addendum)
[facility]    CSN: [id number] Arrival date & time: 01/31/23  1802      History   Chief Complaint Chief Complaint  Patient presents with   Rash    HPI Randy Barrera is a 68 y.o. male.   Randy Barrera is a 68 y.o. male presenting for chief complaint of Rash that started 3 days ago to the right upper back. Rash has started to spread and has wrapped around the right side to the right anterior abdomen within the last 24 hours. Rash is itchy and slightly tender. Rash affects the left lower back as well. He has never had shingles. Denies recent changes in laundry detergent, personal hygiene products, medications, exposure to environmental allergens, and changes in foods. No recent N/V/D, fever, chills, or sick contacts with similar rash. No chest pain, sore throat, shortness of breath. Denies recent steroid use. This type of rash has never happened in the past. He has not attempted use of any OTC medications to help with rash PTA.      Past Medical History:  Diagnosis Date   Family history of prostate cancer    Foot fracture 2015   GERD (gastroesophageal reflux disease)    Hypertension    Prostate cancer [facility])     Patient Active Problem List   Diagnosis Date Noted   Coronary artery calcification 02/13/2022   Right carotid bruit 02/13/2022   Former smoker 02/13/2022   Aortic atherosclerosis (HCC) 02/13/2022   Abnormal gait 02/13/2021   Actinic keratosis 02/13/2021   Chronic kidney disease, stage 3a (HCC) 02/13/2021   Dyspepsia 02/13/2021   Gastroesophageal reflux disease 02/13/2021   Insomnia 02/13/2021   Localized, primary osteoarthritis of hand 02/13/2021   Mixed hyperlipidemia 02/13/2021   Other long term (current) drug therapy 02/13/2021   Sleep disturbance 02/13/2021   Tobacco use 02/13/2021   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   Pain 05/05/2019   Hypertension 03/03/2019   Genetic testing 02/24/2018   Family history of prostate  cancer    Prostate cancer [facility])    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   Vitamin B12 deficiency 02/06/2017   Physical examination of potential organ donor 02/05/2017   Nocturia 07/13/2013   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    Past Surgical History:  Procedure Laterality Date   FOOT FRACTURE SURGERY Right 2015   HYDROCELE EXCISION Left 07/23/2022   Procedure: LEFT HYDROCELECTOMY ADULT;  Surgeon: Bjorn Pippin, MD;  Location: [facility];  Service: Urology;  Laterality: Left;   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 Authorizing Provider  predniSONE (DELTASONE) 20 MG tablet Take 2 tablets (40 mg total) by mouth daily with breakfast for 5 days. 01/31/23 02/05/23 Yes Carlisle Beers, FNP  acetaminophen (TYLENOL) 500 MG tablet Hold this medication while you are taking the narcotic pain pain medication 03/20/17   [provider]  amLODipine (NORVASC) 10 MG tablet  11/10/18   [provider]  aspirin EC 81 MG tablet Take 1 tablet (81 mg total) by mouth daily. Swallow whole. Patient not taking: Reported on 01/31/2023 02/13/22   Riley Lam A, MD  doxazosin (CARDURA) 8 MG tablet Take 8 mg by mouth daily.    [provider]  esomeprazole (NEXIUM) 20 MG capsule 40 mg daily at 6 (six) AM.    [provider]  ezetimibe (ZETIA) 10 MG tablet Take 1 tablet (10 mg total) by mouth daily.  09/04/22   Chandrasekhar, [place] Dragon A, MD  rosuvastatin (CRESTOR) 40 MG tablet Take 1 tablet (40 mg total) by mouth daily. 05/09/22   Christell Constant, MD  traZODone (DESYREL) 50 MG tablet Take 100 mg by mouth at bedtime as needed. 11/11/18   [provider]    Family History Family History  Problem Relation Age of Onset   Prostate cancer Father    Prostate cancer Brother    Cancer Paternal Grandmother    Breast cancer Neg Hx    Colon cancer Neg Hx    Pancreatic cancer Neg Hx     Social History Social History   Tobacco Use   Smoking status: Former    Current packs/day: 0.00    Average packs/day: 2.0 packs/day for 20.0 years (40.0 ttl pk-yrs)    Types: Cigarettes    Start date: 10/05/1974    Quit date: 10/05/1994    Years since quitting: 28.3   Smokeless tobacco: Never  Vaping Use   Vaping status: Never Used  Substance Use Topics   Alcohol use: Yes    Alcohol/week: 2.0 standard drinks of alcohol    Types: 2 Cans of beer per week    Comment: 2 burbons per day 4 oz day   Drug use: No     Allergies   Lisinopril   Review of Systems Review of Systems Per HPI  Physical Exam Triage Vital Signs ED Triage Vitals  Encounter Vitals Group     BP 01/31/23 1818 133/84     Systolic BP Percentile --      Diastolic BP Percentile --      Pulse Rate 01/31/23 1818 (!) 55     Resp 01/31/23 1818 16     Temp 01/31/23 1818 98.1 F (36.7 C)     Temp Source 01/31/23 1818 Oral     SpO2 01/31/23 1818 96 %     Weight --      Height --      Head Circumference --      Peak Flow --      Pain Score 01/31/23 1820 1     Pain Loc --      Pain Education --      Exclude from Growth Chart --    No data found.  Updated Vital Signs BP 133/84 (BP Location: Right Arm)   Pulse (!) 55   Temp 98.1 F (36.7 C) (Oral)   Resp 16   SpO2 96%   Visual Acuity Right Eye Distance:   Left Eye Distance:   Bilateral Distance:    Right Eye Near:   Left Eye Near:    Bilateral  Near:     Physical Exam Vitals and nursing note reviewed.  Constitutional:      Appearance: He is not ill-appearing or toxic-appearing.  HENT:     Head: Normocephalic and atraumatic.     Right Ear: Hearing and external ear normal.     Left Ear: Hearing and  external ear normal.     Nose: Nose normal.     Mouth/Throat:     Lips: Pink.  Eyes:     General: Lids are normal. Vision grossly intact. Gaze aligned appropriately.     Extraocular Movements: Extraocular movements intact.     Conjunctiva/sclera: Conjunctivae normal.  Pulmonary:     Effort: Pulmonary effort is normal.  Musculoskeletal:     Cervical back: Neck supple.  Skin:    General: Skin is warm and dry.     Capillary Refill: Capillary refill takes less than 2 seconds.     Findings: Rash present. Rash is macular and papular.          Comments: Erythematous maculopapular rash to the bilateral back wrapping around to the right anterior abdomen. See images below. No vesicular lesions. Patient actively itching rash.  Neurological:     General: No focal deficit present.     Mental Status: He is alert and oriented to person, place, and time. Mental status is at baseline.     Cranial Nerves: No dysarthria or facial asymmetry.  Psychiatric:        Mood and Affect: Mood normal.        Speech: Speech normal.        Behavior: Behavior normal.        Thought Content: Thought content normal.        Judgment: Judgment normal.    Lower back   Anterior right abdomen/chest    UC Treatments / Results  Labs (all labs ordered are listed, but only abnormal results are displayed) Labs Reviewed - No data to display  EKG   Radiology No results found.  Procedures Procedures (including critical care time)  Medications Ordered in UC Medications  methylPREDNISolone acetate (DEPO-MEDROL) injection 40 mg (40 mg Intramuscular Given 01/31/23 1849)    Initial Impression / Assessment and Plan / UC Course  I have reviewed the  triage vital signs and the nursing notes.  Pertinent labs & imaging results that were available during my care of the patient were reviewed by me and considered in my medical decision making (see chart for details).   1. Allergic dermatitis Presentation consistent with acute hypersensitivity reaction, likely acute allergic reaction.  No signs of anaphylaxis, HEENT exam stable, lungs clear.  Will treat with steroids, antihistamines, H2 blocker (famotidine) and supportive care as outlined in AVS. Low suspicion for shingles given no vesicular lesions and rash is to the bilateral back crossing multiple dermatomes. Given DepoMedrol 40mg  IM in clinic for acute symptoms.  Advised to avoid known and potential allergens. Follow-up with PCP and/or allergy specialist.   Counseled patient on potential for adverse effects with medications prescribed/recommended today, strict ER and return-to-clinic precautions discussed, patient verbalized understanding.    Final Clinical Impressions(s) / UC Diagnoses   Final diagnoses:  Allergic dermatitis     Discharge Instructions      You have been evaluated today for an allergic reaction. We gave you medicine to help with symptoms.   Take medications sent to pharmacy as directed.  You may take an over the counter antihistamine (Claritin or Zyrtec) for the next 5-7 days as well as a medication called famotidine (Pepcid) over the counter 20mg  daily for 5-7 days to further reduce your symptoms.   Please schedule an appointment with your primary care provider for follow-up and ongoing management. Return if you experience rashes, difficulty breathing or swallowing, lip/mouth/tongue swelling, vomiting, or for any other concerning symptoms. If symptoms  are severe, please go to the ER for further workup.     ED Prescriptions     Medication Sig Dispense Auth. Provider   predniSONE (DELTASONE) 20 MG tablet Take 2 tablets (40 mg total) by mouth daily with  breakfast for 5 days. 10 tablet Carlisle Beers, FNP      PDMP not reviewed this encounter.      Carlisle Beers, [place] 01/31/23 1857

## 2023-01-31 NOTE — ED Triage Notes (Addendum)
Pt presents with rash on right side of back that is slowly spreading. First notice 3 days ago. States it is itchy and painful to touch.

## 2023-01-31 NOTE — Discharge Instructions (Signed)
You have been evaluated today for an allergic reaction. We gave you medicine to help with symptoms.   Take medications sent to pharmacy as directed.  You may take an over the counter antihistamine (Claritin or Zyrtec) for the next 5-7 days as well as a medication called famotidine (Pepcid) over the counter 20mg  daily for 5-7 days to further reduce your symptoms.   Please schedule an appointment with your primary care provider for follow-up and ongoing management. Return if you experience rashes, difficulty breathing or swallowing, lip/mouth/tongue swelling, vomiting, or for any other concerning symptoms. If symptoms are severe, please go to the ER for further workup.

## 2023-02-10 DIAGNOSIS — Z524 Kidney donor: Secondary | ICD-10-CM | POA: Diagnosis not present

## 2023-02-10 DIAGNOSIS — I10 Essential (primary) hypertension: Secondary | ICD-10-CM | POA: Diagnosis not present

## 2023-02-10 DIAGNOSIS — G4700 Insomnia, unspecified: Secondary | ICD-10-CM | POA: Diagnosis not present

## 2023-02-10 DIAGNOSIS — Z0000 Encounter for general adult medical examination without abnormal findings: Secondary | ICD-10-CM | POA: Diagnosis not present

## 2023-02-10 DIAGNOSIS — K219 Gastro-esophageal reflux disease without esophagitis: Secondary | ICD-10-CM | POA: Diagnosis not present

## 2023-02-10 DIAGNOSIS — E782 Mixed hyperlipidemia: Secondary | ICD-10-CM | POA: Diagnosis not present

## 2023-02-12 DIAGNOSIS — Z0000 Encounter for general adult medical examination without abnormal findings: Secondary | ICD-10-CM | POA: Diagnosis not present

## 2023-02-17 NOTE — Telephone Encounter (Signed)
 VOB initiated for Zilretta for RIGHT knee OA.

## 2023-02-21 NOTE — Telephone Encounter (Signed)
 [name] - Prior Authorization NOT required  Pharmacy Benefit - Prior Authorization REQUIRED

## 2023-02-25 NOTE — Telephone Encounter (Signed)
[name]  Prior Auth for [place] APPROVED PA# [id number] Valid: 02/25/23-02/25/24

## 2023-02-25 NOTE — Telephone Encounter (Signed)
Pharmacy Benefit   Prior Authorization initiated for [facility] via [email or web address] KEY: [id number]

## 2023-02-26 NOTE — Telephone Encounter (Signed)
Holding until needed. *confirm we have stock before scheduling*

## 2023-02-26 NOTE — Telephone Encounter (Signed)
ZILRETTA for RIGHT knee OA  OK to schedule on or after 03/13/23  Medical Benefit  Primary Insurance: [facility] AARP Medicare Adv HMO-POS Co-pay: $20 Co-insurance: 20% Deductible: does not apply Prior Auth: NOT required  Pharmacy Benefit  PBM: [employer] Co-pay: undisclosed Co-insurance: undisclosed Deductible: $0 of $590 met Prior Auth: APPROVED PA# [id number] Valid 02/25/23-01/14/24   Knee Injection History 08/02/22 - Zilretta RIGHT 12/18/22 - Zilretta RIGHT

## 2023-02-26 NOTE — Telephone Encounter (Signed)
Pharmacy Benefit  Prior Auth for [facility] APPROVED PA# [id number] Valid 02/25/23-01/14/24 [employer]

## 2023-03-12 ENCOUNTER — Ambulatory Visit: Payer: Medicare Other | Attending: Internal Medicine | Admitting: Internal Medicine

## 2023-03-12 ENCOUNTER — Encounter: Payer: Self-pay | Admitting: Internal Medicine

## 2023-03-12 VITALS — BP 122/68 | HR 69 | Ht 72.0 in | Wt 188.4 lb

## 2023-03-12 DIAGNOSIS — I700 Atherosclerosis of aorta: Secondary | ICD-10-CM | POA: Diagnosis not present

## 2023-03-12 DIAGNOSIS — I2510 Atherosclerotic heart disease of native coronary artery without angina pectoris: Secondary | ICD-10-CM

## 2023-03-12 DIAGNOSIS — R0989 Other specified symptoms and signs involving the circulatory and respiratory systems: Secondary | ICD-10-CM | POA: Diagnosis not present

## 2023-03-12 DIAGNOSIS — I491 Atrial premature depolarization: Secondary | ICD-10-CM | POA: Insufficient documentation

## 2023-03-12 NOTE — Patient Instructions (Addendum)
 Medication Instructions:  Your physician has recommended you make the following change in your medication:  STOP: ezetimibe (Zetia)   *If you need a refill on your cardiac medications before your next appointment, please call your pharmacy*   Lab Work: IN 3 MONTHS at [facility]: Fasting lipid panel, ALT  If you have labs (blood work) drawn today and your tests are completely normal, you will receive your results only by: MyChart Message (if you have MyChart) OR A paper copy in the mail If you have any lab test that is abnormal or we need to change your treatment, we will call you to review the results.   Testing/Procedures: NONE   Follow-Up: At [facility], you and your health needs are our priority.  As part of our continuing mission to provide you with exceptional heart care, we have created designated Provider Care Teams.  These Care Teams include your primary Cardiologist (physician) and Advanced Practice Providers (APPs -  Physician Assistants and Nurse Practitioners) who all work together to provide you with the care you need, when you need it.   Your next appointment:   1 year(s)  Provider:   Riley Lam, MD

## 2023-03-12 NOTE — Progress Notes (Signed)
 Cardiology Office Note:  .    Date:  03/12/2023  ID:  Parthiv Mucci, DOB 1955/06/29, MRN [id number] PCP: Daisy Floro, MD  [facility] Providers Cardiologist:  Christell Constant, MD n    CC: Secondary prevention  History of Present Illness: .    Discussed the use of AI scribe software for clinical note transcription with the patient, who gave verbal consent to proceed.  Bensyn Bornemann is a 68 y.o. male  HTN, HLD, CKD stage IIIa vs prior kidney donation), Former tobacco abuse, who presented after CT. (Multivessel CAC).  Since the last visit, he was found to have mild carotid artery stenosis. A stress test showed normal global myocardial blood flow reserve. His LDL cholesterol is under 55, and his blood pressure is well-controlled, with an average reading of 130/85 mmHg. His pulse is usually low, in the low fifties or high forties.  He experiences dizziness, particularly when moving his head, which sometimes necessitates sitting down to prevent falling. No chest pain, breathing issues, or palpitations. He feels winded at times but has not had any episodes of syncope.  He experiences significant muscle cramping, which he associates with the increase in his statin medication. Previously, he had some cramping with a lower dosage of the statin, but the current level of cramping is described as 'not bearable'. He has been taking ezetimibe in the morning instead of at night with rosuvastatin, but the cramps persist.  He has noted isolated premature atrial contractions but is asymptomatic regarding these.  Socially, he remains active, playing with his dog and golfing regularly.  Relevant histories: .  Social: At baseline he is able to play with the dog and play golf. 2024: Found to have mild CAS Family history: Father had two MI's; Older brother has A fib.   ROS: As per HPI.   Studies Reviewed: .   Cardiac Studies & Procedures    ______________________________________________________________________________________________   STRESS TESTS  NM PET CT CARDIAC PERFUSION MULTI W/ABSOLUTE BLOODFLOW 04/10/2022  Narrative   LV perfusion is normal. There is no evidence of ischemia. There is no evidence of infarction.   Rest left ventricular function is normal. Rest EF: 59 %. Stress left ventricular function is normal. Stress EF: 66 %. End diastolic cavity size is normal. End systolic cavity size is normal.   Myocardial blood flow was computed to be 0.76ml/g/min at rest and 1.47ml/g/min at stress. Global myocardial blood flow reserve was 2.45 and was normal.   Coronary calcium was present on the attenuation correction CT images. Severe coronary calcifications were present. Coronary calcifications were present in the left anterior descending artery, left circumflex artery and right coronary artery distribution(s).   The study is normal. The study is low risk.   Electronically signed by Epifanio Lesches, MD  CLINICAL DATA:  This over-read does not include interpretation of cardiac or coronary anatomy or pathology. The Cardiac PET CT interpretation by the cardiologist is attached.  COMPARISON:  02/20/2021 chest CT  FINDINGS: No pleural fluid.  Clear imaged lungs.  Aortic atherosclerosis.  No imaged thoracic adenopathy.  Normal imaged portions of the liver, spleen, stomach, adrenal glands, right kidney.  No acute osseous abnormality.  IMPRESSION: No acute findings in the imaged extracardiac chest.  Aortic Atherosclerosis (ICD10-I70.0).   Electronically Signed By: Jeronimo Greaves M.D. On: 04/10/2022 10:49            ______________________________________________________________________________________________      Physical Exam:    VS:  BP 122/68 (BP Location:  Left Arm)   Pulse 69   Ht 6' (1.829 m)   Wt 188 lb 6.4 oz (85.5 kg)   SpO2 97%   BMI 25.55 kg/m    Wt Readings from Last 3 Encounters:   03/12/23 188 lb 6.4 oz (85.5 kg)  12/18/22 188 lb (85.3 kg)  11/14/22 185 lb (83.9 kg)    Gen: No distress   Neck: No JVD, Right carotid bruit Cardiac: No Rubs or Gallops, no murmur, RRR +2 radial pulses Respiratory: Clear to auscultation bilaterally, normal effort, normal  respiratory rate GI: Soft, nontender, non-distended  MS: No  edema;  moves all extremities Integument: Skin feels warm Neuro:  At time of evaluation, alert and oriented to person/place/time/situation  Psych: Normal affect, patient feels well   ASSESSMENT AND PLAN: .     Hyperlipidemia Aortic atherosclerosis Current myalgias likely due to medication. LDL is under 55, and all labs are at goal. He attributes the cramps to ezetimibe rather than rosuvastatin. Discussed risks and benefits of discontinuing ezetimibe versus reducing rosuvastatin. Explained that newer medications like PCSK9 inhibitors have fewer side effects but are challenging to get covered by insurance. - Discontinue ezetimibe - Check labs in three months - If symptoms persist, discuss newer medications like PCSK9 inhibitors  Coronary Artery Calcifications No evidence of angina or obstructive coronary disease. Stress test showed normal global myocardial blood flow reserve. Emphasized the importance of maintaining current cholesterol levels and monitoring for symptoms. - he is smoke free  Carotid Artery Stenosis Mild bilateral carotid artery stenosis with a soft right carotid bruit. No new changes noted. Discussed the importance of monitoring and reimaging only if new symptoms arise. - Reimage in 2027 unless there are new changes  Premature Atrial Contractions (PACs) Isolated PACs but asymptomatic. No evidence of atrial fibrillation or flutter. Explained that PACs are not likely to cause cardiac dysfunction and do not require immediate intervention unless symptomatic.  Hypertension Blood pressure well-controlled. Average home readings around  130/80.  Chronic Kidney Disease (CKD) Stage 3/ Vs. Kidney Donor - judicious use of contrast in the future  Follow-up - Follow-up in one year - Monitor for new symptoms or changes in condition.   Riley Lam, MD FASE [facility] Cardiologist [facility]  [address] [place], [address] [place], [place] [address] [phone]  8:51 AM

## 2023-04-16 ENCOUNTER — Ambulatory Visit: Admitting: Family Medicine

## 2023-04-16 ENCOUNTER — Ambulatory Visit (INDEPENDENT_AMBULATORY_CARE_PROVIDER_SITE_OTHER)

## 2023-04-16 ENCOUNTER — Other Ambulatory Visit: Payer: Self-pay

## 2023-04-16 ENCOUNTER — Encounter: Payer: Self-pay | Admitting: Family Medicine

## 2023-04-16 VITALS — BP 126/84 | HR 43 | Ht 72.0 in | Wt 184.0 lb

## 2023-04-16 DIAGNOSIS — M25561 Pain in right knee: Secondary | ICD-10-CM

## 2023-04-16 DIAGNOSIS — M1711 Unilateral primary osteoarthritis, right knee: Secondary | ICD-10-CM

## 2023-04-16 DIAGNOSIS — G8929 Other chronic pain: Secondary | ICD-10-CM

## 2023-04-16 DIAGNOSIS — M47816 Spondylosis without myelopathy or radiculopathy, lumbar region: Secondary | ICD-10-CM | POA: Diagnosis not present

## 2023-04-16 DIAGNOSIS — M545 Low back pain, unspecified: Secondary | ICD-10-CM | POA: Diagnosis not present

## 2023-04-16 MED ORDER — TRIAMCINOLONE ACETONIDE 32 MG IX SRER
32.0000 mg | Freq: Once | INTRA_ARTICULAR | Status: AC
Start: 2023-04-16 — End: 2023-04-16
  Administered 2023-04-16: 32 mg via INTRA_ARTICULAR

## 2023-04-16 NOTE — Progress Notes (Signed)
 I, Stevenson Clinch, CMA acting as a scribe for [name], MD.  Randy Barrera is a 67 y.o. male who presents to [facility] today for cont'd R knee and low back pain. Pt was last seen by Dr. Denyse Amass on 12/18/22 and was given a repeat Zilretta injection. Last facet injections, 12/27/22.  Today, pt reports improvement of knee sx with Zilretta inj. Sx starting to flare up again over the past 1-2 weeks. Denies swelling or instability. Taking Tylenol prn. Would like to discuss chronic lower back pain and acute flare up of right-sided low back pain. Sx are localized. Does note one occurrence of leak weakness while walking the dog over the weekend.    Dx imaging: 08/26/22 L-spine MRI             07/29/22 L-spine XR  11/05/21 R knee MRI  Pertinent review of systems: No fevers or chills  Relevant historical information: History of prostate cancer.   Exam:  BP 126/84   Pulse (!) 43   Ht 6' (1.829 m)   Wt 184 lb (83.5 kg)   SpO2 97%   BMI 24.95 kg/m  General: Well Developed, well nourished, and in no acute distress.   MSK: Right knee moderate effusion normal motion with crepitation.  L-spine tender palpation right lumbar paraspinal musculature normal lumbar motion lower extremity strength is intact.    Lab and Radiology Results   Zilretta injection right knee Procedure: Real-time Ultrasound Guided Injection of right knee joint superior lateral patellar space Device: Philips Affiniti 50G Images permanently stored and available for review in PACS Verbal informed consent obtained.  Discussed risks and benefits of procedure. Warned about infection, hyperglycemia bleeding, damage to structures among others. Patient expresses understanding and agreement Time-out conducted.   Noted no overlying erythema, induration, or other signs of local infection.   Skin prepped in a sterile fashion.   Local anesthesia: Topical Ethyl chloride.   With sterile technique and  under real time ultrasound guidance: Zilretta 32 mg injected into knee joint. Fluid seen entering the joint capsule.   Completed without difficulty   Advised to call if fevers/chills, erythema, induration, drainage, or persistent bleeding.   Images permanently stored and available for review in the ultrasound unit.  Impression: Technically successful ultrasound guided injection.  Lot number: [id number]  X-ray images right knee obtained today personally and independently interpreted. Mild medial DJD Await formal radiology review   Assessment and Plan: 68 y.o. 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  He did have an MRI of this knee in 2023 that did also show degenerative meniscus tear that could be contributory as well.  He has been doing okay with Zilretta injections.  Previous injection was over 3 months ago.  Plan for repeat Zilretta injection today.  Chronic right low back pain.  Patient did have a lumbar spine MRI in August 2024 that shows multiple areas that could be pain producers.  He had some benefit from right-sided facet injections at L4-5 and L5-S1 but medial branch blocks did not provide any relief at these locations.  Plan for trial of an epidural steroid injection or nerve root block or both.   PDMP not reviewed this encounter. Orders Placed This Encounter  Procedures   [place] LIMITED JOINT SPACE STRUCTURES LOW RIGHT(NO LINKED CHARGES)    Reason for Exam (SYMPTOM  OR DIAGNOSIS REQUIRED):   right knee pain    Preferred imaging location?:   [facility]  DG Knee AP/LAT W/[place] Right    Standing Status:   Future    Number of Occurrences:   1    Expiration Date:   05/16/2023    Reason for Exam (SYMPTOM  OR DIAGNOSIS REQUIRED):   right knee pain    Preferred imaging location?:   [facility] [place]   DG Epidural/Nerve Root    Standing Status:   Future    Expiration Date:   04/15/2024    Reason for Exam (SYMPTOM  OR DIAGNOSIS REQUIRED):   nerve root  block, ESI, or both; level as per radiology    Preferred Imaging Location?:   [facility]   Meds ordered this encounter  Medications   Triamcinolone Acetonide (ZILRETTA) intra-articular injection 32 mg     Discussed warning signs or symptoms. Please see discharge instructions. Patient expresses understanding.   The above documentation has been reviewed and is accurate and complete [name], M.D.

## 2023-04-16 NOTE — Patient Instructions (Addendum)
 Thank you for coming in today.  You received an injection today. Seek immediate medical attention if the joint becomes red, extremely painful, or is oozing fluid.  Please get an Xray today before you leave

## 2023-04-24 ENCOUNTER — Ambulatory Visit: Admitting: Family Medicine

## 2023-04-24 NOTE — Discharge Instructions (Signed)

## 2023-04-25 ENCOUNTER — Ambulatory Visit
Admission: RE | Admit: 2023-04-25 | Discharge: 2023-04-25 | Disposition: A | Discharge status: Home / Self Care | Source: Ambulatory Visit | Attending: Family Medicine | Admitting: Family Medicine

## 2023-04-25 ENCOUNTER — Other Ambulatory Visit: Payer: Self-pay | Admitting: Family Medicine

## 2023-04-25 DIAGNOSIS — M1711 Unilateral primary osteoarthritis, right knee: Secondary | ICD-10-CM

## 2023-04-25 DIAGNOSIS — G8929 Other chronic pain: Secondary | ICD-10-CM

## 2023-04-25 DIAGNOSIS — M47816 Spondylosis without myelopathy or radiculopathy, lumbar region: Secondary | ICD-10-CM

## 2023-04-25 DIAGNOSIS — M545 Low back pain, unspecified: Secondary | ICD-10-CM

## 2023-04-25 DIAGNOSIS — M4807 Spinal stenosis, lumbosacral region: Secondary | ICD-10-CM | POA: Diagnosis not present

## 2023-04-25 DIAGNOSIS — M48061 Spinal stenosis, lumbar region without neurogenic claudication: Secondary | ICD-10-CM | POA: Diagnosis not present

## 2023-04-25 MED ORDER — IOPAMIDOL (ISOVUE-M 200) INJECTION 41%
1.0000 mL | Freq: Once | INTRAMUSCULAR | Status: AC
  Administered 2023-04-25: 1 mL via EPIDURAL

## 2023-04-25 MED ORDER — METHYLPREDNISOLONE ACETATE 40 MG/ML INJ SUSP (RADIOLOG
80.0000 mg | Freq: Once | INTRAMUSCULAR | Status: AC
  Administered 2023-04-25: 80 mg via EPIDURAL

## 2023-05-05 NOTE — Progress Notes (Signed)
Right knee x-ray shows mild arthritis underneath the kneecap.

## 2023-05-07 DIAGNOSIS — R3912 Poor urinary stream: Secondary | ICD-10-CM | POA: Diagnosis not present

## 2023-07-16 DIAGNOSIS — L570 Actinic keratosis: Secondary | ICD-10-CM | POA: Diagnosis not present

## 2023-07-16 DIAGNOSIS — Z85828 Personal history of other malignant neoplasm of skin: Secondary | ICD-10-CM | POA: Diagnosis not present

## 2023-07-16 DIAGNOSIS — Z08 Encounter for follow-up examination after completed treatment for malignant neoplasm: Secondary | ICD-10-CM | POA: Diagnosis not present

## 2023-07-16 DIAGNOSIS — D225 Melanocytic nevi of trunk: Secondary | ICD-10-CM | POA: Diagnosis not present

## 2023-07-16 DIAGNOSIS — L821 Other seborrheic keratosis: Secondary | ICD-10-CM | POA: Diagnosis not present

## 2023-07-16 DIAGNOSIS — L814 Other melanin hyperpigmentation: Secondary | ICD-10-CM | POA: Diagnosis not present

## 2023-08-12 ENCOUNTER — Telehealth: Payer: Self-pay | Admitting: Family Medicine

## 2023-08-12 NOTE — Telephone Encounter (Signed)
 Ran benefits dfor right knee zilretta  injection CASE ID [id number]

## 2023-08-12 NOTE — Telephone Encounter (Signed)
 Patient scheduled an appointment through MyChart for tomorrow (7/30) for repeat Zilretta . I have this in stock but can you confirm authorization?

## 2023-08-12 NOTE — Telephone Encounter (Signed)
 Patient is aware that we are working on authorization and will contact patient if need to reschedule.

## 2023-08-13 ENCOUNTER — Other Ambulatory Visit: Payer: Self-pay

## 2023-08-13 ENCOUNTER — Ambulatory Visit: Admitting: Family Medicine

## 2023-08-13 ENCOUNTER — Encounter: Payer: Self-pay | Admitting: Family Medicine

## 2023-08-13 VITALS — BP 132/86 | HR 58 | Ht 72.0 in | Wt 185.0 lb

## 2023-08-13 DIAGNOSIS — M25561 Pain in right knee: Secondary | ICD-10-CM | POA: Diagnosis not present

## 2023-08-13 DIAGNOSIS — G8929 Other chronic pain: Secondary | ICD-10-CM | POA: Diagnosis not present

## 2023-08-13 DIAGNOSIS — M1711 Unilateral primary osteoarthritis, right knee: Secondary | ICD-10-CM | POA: Diagnosis not present

## 2023-08-13 MED ORDER — TRIAMCINOLONE ACETONIDE 32 MG IX SRER
32.0000 mg | Freq: Once | INTRA_ARTICULAR | Status: AC
  Administered 2023-08-13: 32 mg via INTRA_ARTICULAR

## 2023-08-13 NOTE — Telephone Encounter (Signed)
 Zilretta  authorized for right knee NO PA REQUIRED Coinsurance 80% Copay $20 Deductible does not apply OOP MAX $3900 has met $318.34 Once OOP has been met patient will be covered at 100%and copay will no longer apply REFERENCE # [id number]

## 2023-08-13 NOTE — Progress Notes (Signed)
   I, Leotis Batter, CMA acting as a scribe for Artist Lloyd, MD.  Randy Barrera is a 68 y.o. male who presents to [facility] today for exacerbation of his R knee pain. Pt was last seen by Dr. Lloyd on 04/16/23 and was given a repeat R knee Zilretta  injection.  Today, pt reports exacerbation of right knee sx over the past week. Sx responded well to last Zilretta  injection. Denies swelling. Some catching and aching. 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   He notes his lumbar radicular pain has improved considerably following an epidural steroid injection in April.  Dx imaging: 04/16/23 R knee XR 11/05/21 R knee MRI   Pertinent review of systems: No fevers or chills  Relevant historical information: History of prostate cancer.   Exam:  BP 132/86   Pulse (!) 58   Ht 6' (1.829 m)   Wt 185 lb (83.9 kg)   SpO2 98%   BMI 25.09 kg/m  General: Well Developed, well nourished, and in no acute distress.   MSK: Right knee minimal effusion normal-appearing otherwise normal motion.    Lab and Radiology Results   Zilretta  injection right knee Procedure: Real-time Ultrasound Guided Injection of right knee joint superior lateral patellar space Device: Philips Affiniti 50G Images permanently stored and available for review in PACS Verbal informed consent obtained.  Discussed risks and benefits of procedure. Warned about infection, hyperglycemia bleeding, damage to structures among others. Patient expresses understanding and agreement Time-out conducted.   Noted no overlying erythema, induration, or other signs of local infection.   Skin prepped in a sterile fashion.   Local anesthesia: Topical Ethyl chloride.   With sterile technique and under real time ultrasound guidance: Zilretta  32 mg injected into knee joint. Fluid seen entering the joint capsule.   Completed without difficulty   Advised to call if fevers/chills, erythema,  induration, drainage, or persistent bleeding.   Images permanently stored and available for review in the ultrasound unit.  Impression: Technically successful ultrasound guided injection.  Lot number: [id number]     Assessment and Plan: 68 y.o. male with right knee pain due to DJD.  Plan for repeat Zilretta  injection today.  Can repeat this in 3 months if we need to.  Lumbar radiculopathy currently well-controlled.  Can we authorize an epidural steroid injection in the future with a phone call or MyChart message if needed.   PDMP not reviewed this encounter. Orders Placed This Encounter  Procedures   US  LIMITED JOINT SPACE STRUCTURES LOW RIGHT(NO LINKED CHARGES)    Reason for Exam (SYMPTOM  OR DIAGNOSIS REQUIRED):   right knee pain    Preferred imaging location?:    [facility] ordered this encounter  Medications   Triamcinolone  Acetonide (ZILRETTA ) intra-articular injection 32 mg     Discussed warning signs or symptoms. Please see discharge instructions. Patient expresses understanding.   The above documentation has been reviewed and is accurate and complete Artist Lloyd, M.D.

## 2023-08-13 NOTE — Patient Instructions (Addendum)
 Thank you for coming in today.   You received an injection today. Seek immediate medical attention if the joint becomes red, extremely painful, or is oozing fluid.   Stay cool!

## 2023-10-10 IMAGING — US US SCROTUM W/ DOPPLER COMPLETE
1 series · 13 of 25 positions shown · non-contrast
Comparison: None

CLINICAL DATA: Scrotal swelling, history prostate cancer, encysted
hydrocele

EXAM:
SCROTAL ULTRASOUND
DOPPLER ULTRASOUND OF THE TESTICLES
TECHNIQUE: Complete ultrasound examination of the testicles, epididymis, and
other scrotal structures was performed. Color and spectral Doppler
ultrasound were also utilized to evaluate blood flow to the
testicles.

[Series 1: us scrotum w/ doppler complete · 13 of 53 slices shown]
[im 1/53]
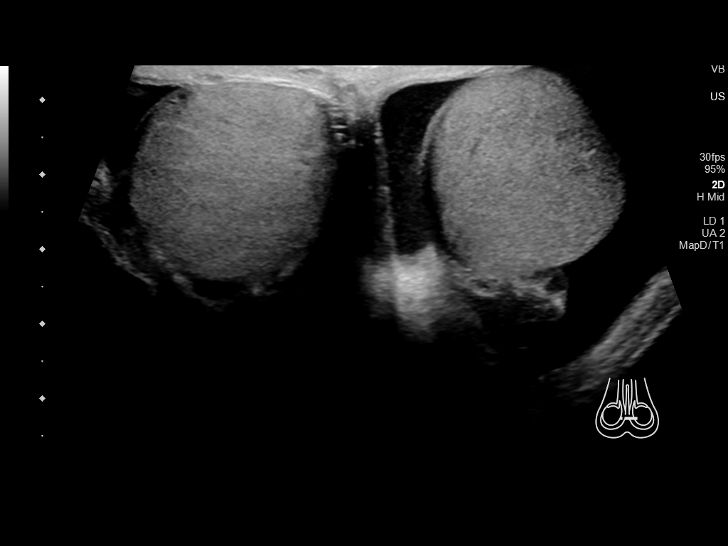
[im 5/53]
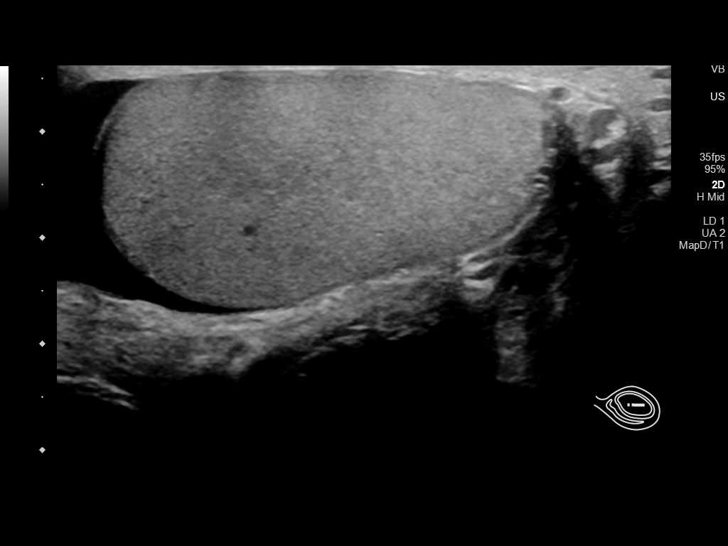
[im 9/53]
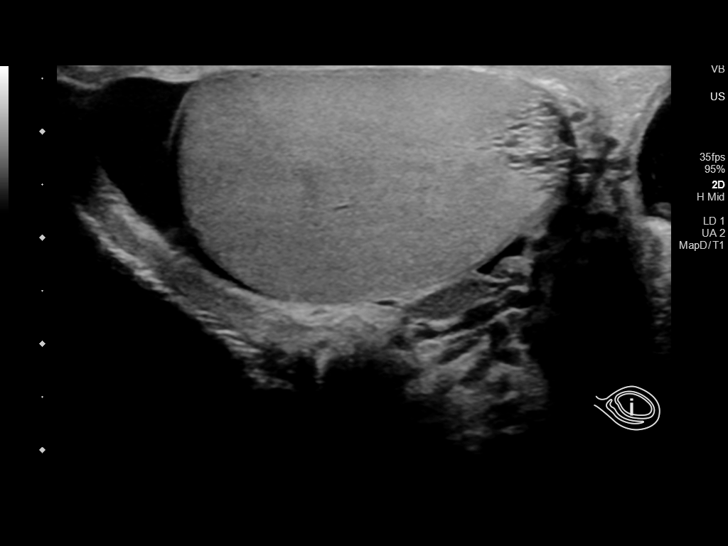
[im 14/53]
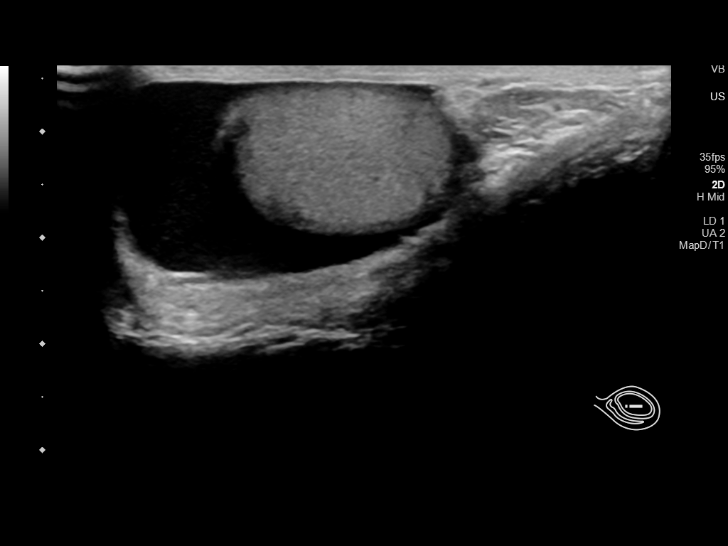
[im 18/53]
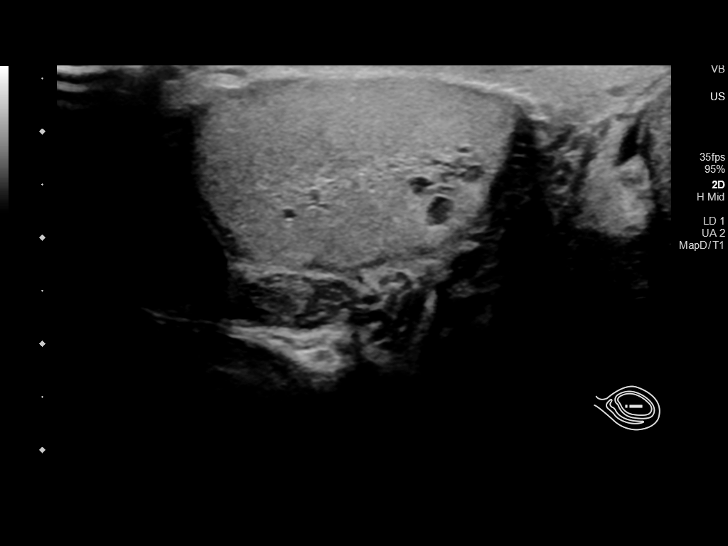
[im 22/53]
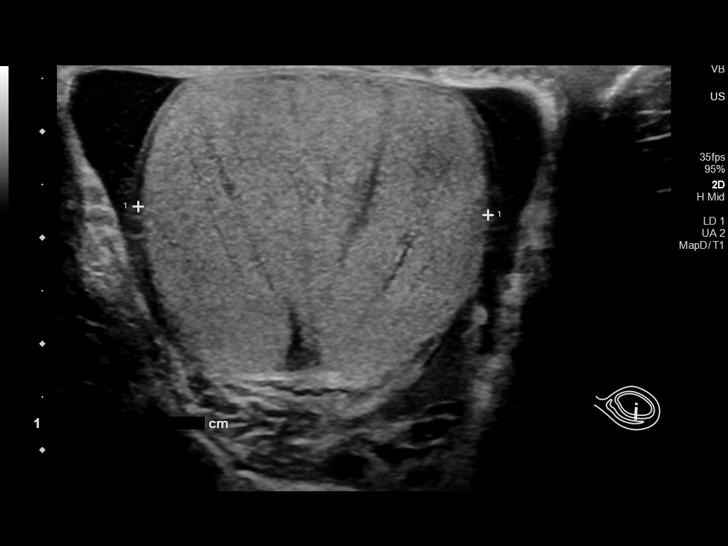
[im 27/53]
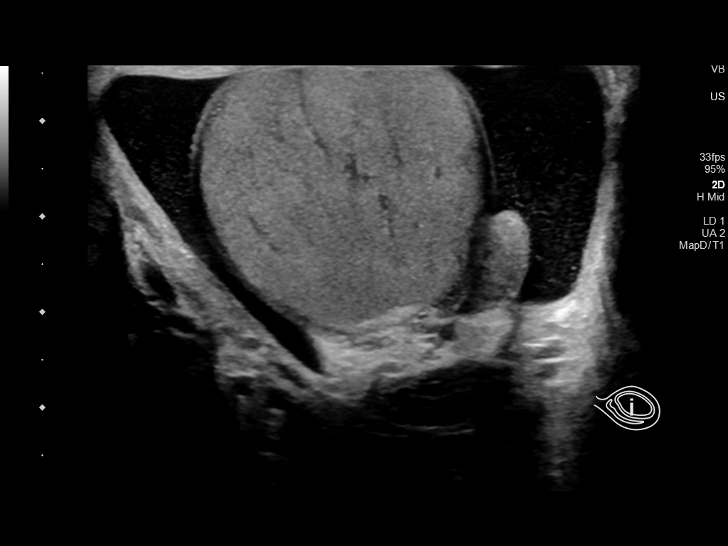
[im 31/53]
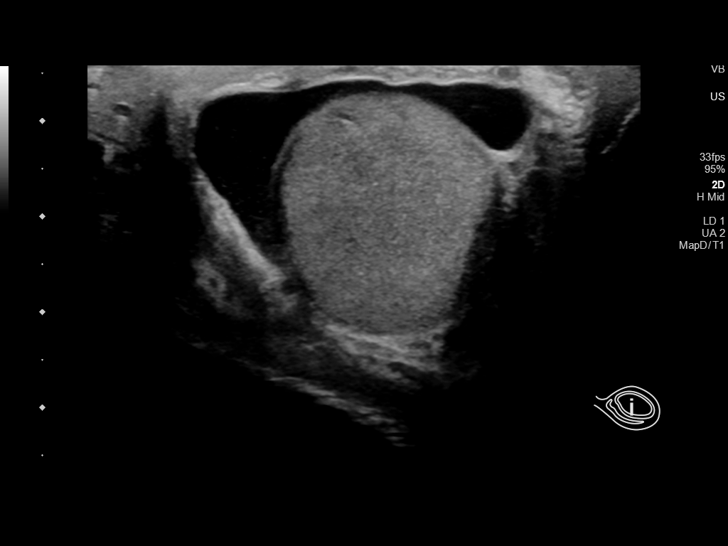
[im 35/53]
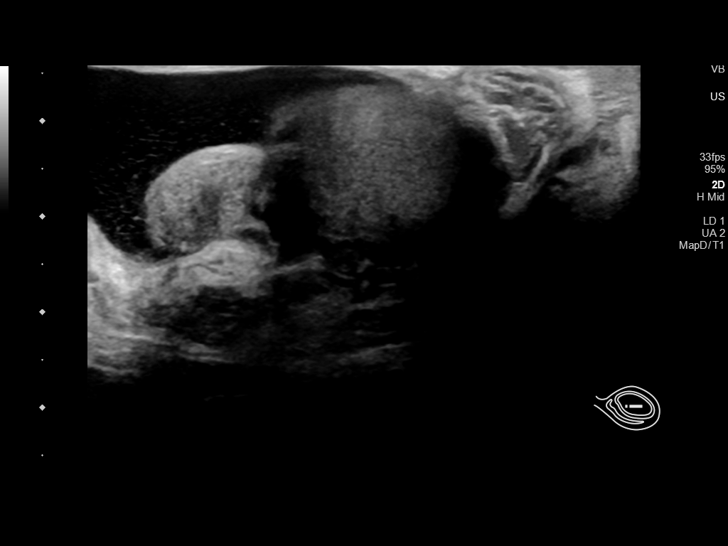
[im 40/53]
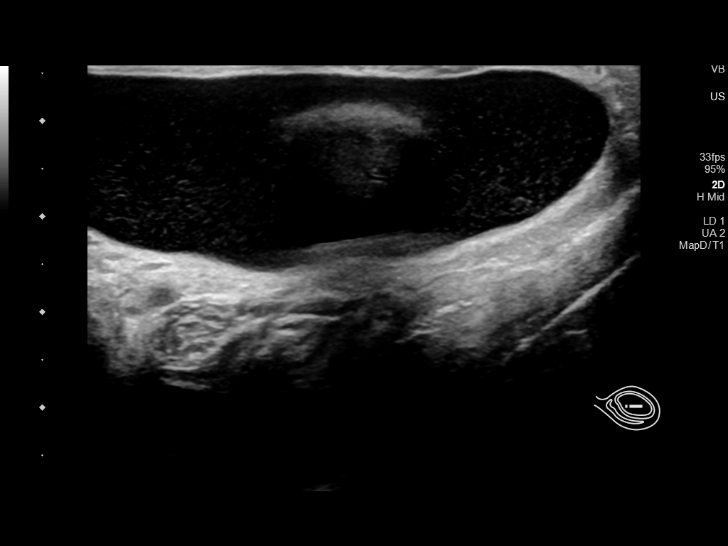
[im 44/53]
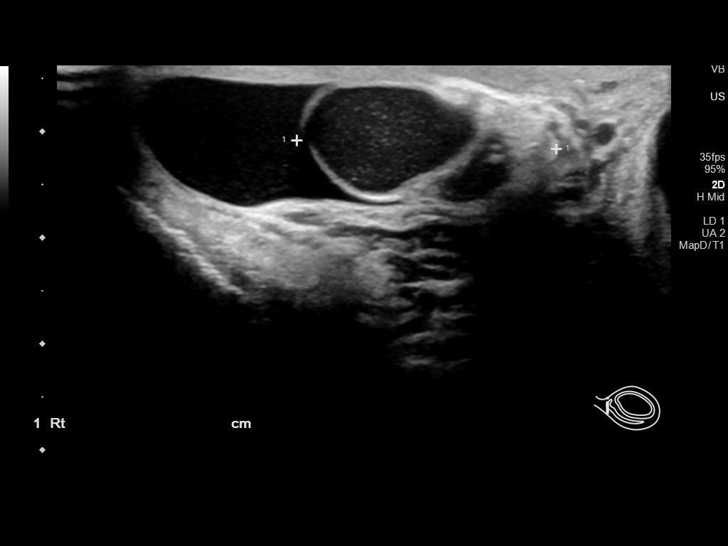
[im 48/53]
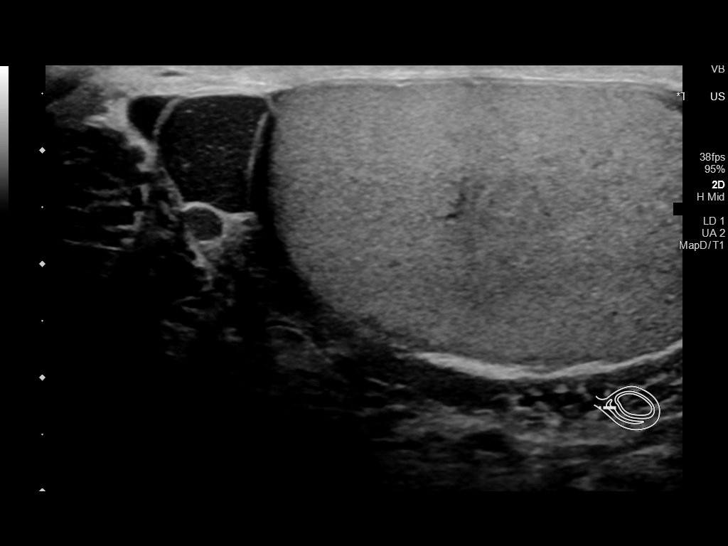
[im 53/53]
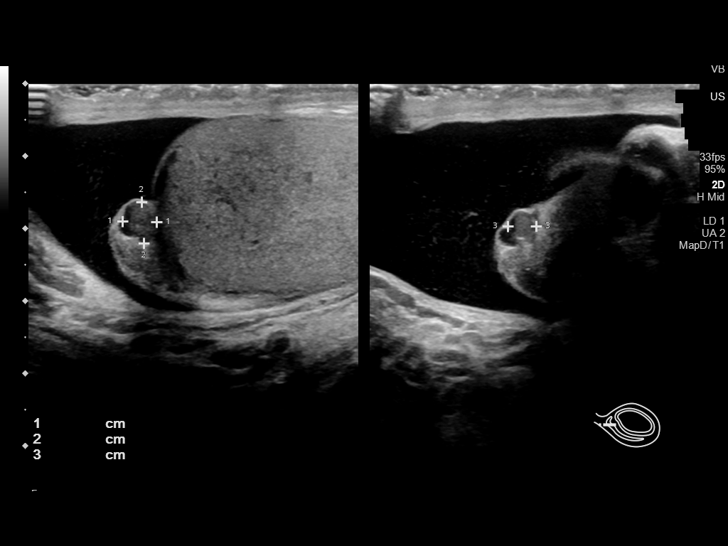

[13 of 25 positions shown; findings below may reference images not displayed]

FINDINGS: Right testicle

Measurements: 4.3 x 2.2 x 3.3 cm. Small focus of tubular ectasia.
Otherwise normal echogenicity without mass or calcification.
Internal blood flow present on color Doppler imaging.

Left testicle

Measurements: 4.8 x 3.3 x 3.0 cm. Normal echogenicity without mass
or calcification. Minimal striations. Internal blood flow present on
color Doppler imaging.

Right epididymis: Complicated cyst at head of RIGHT epididymis 8 x
10 x 14 mm containing low level internal echogenicity favor
spermatocele.

Left epididymis: Tiny complicated cyst likely spermatocele at LEFT
epididymal head 5 x 6 x 4 mm.

Hydrocele: BILATERAL, LEFT greater than RIGHT, containing scattered
debris

Varicocele:  None visualized.

Pulsed Doppler interrogation of both testes demonstrates normal low
resistance arterial and venous waveforms bilaterally.
IMPRESSION: BILATERAL hydroceles, LEFT greater than RIGHT containing scattered
debris.

Small complicated cysts at the epididymal heads bilaterally, larger
on RIGHT 14 mm in greatest size, favor small spermatoceles.

Remainder of exam unremarkable.

## 2023-11-10 ENCOUNTER — Encounter: Payer: Self-pay | Admitting: Internal Medicine

## 2023-11-10 MED ORDER — ROSUVASTATIN CALCIUM 20 MG PO TABS: 20.0000 mg | ORAL_TABLET | Freq: Every day | ORAL | 3 refills | Status: AC

## 2023-12-01 ENCOUNTER — Encounter: Payer: Self-pay | Admitting: Family Medicine

## 2023-12-01 DIAGNOSIS — M545 Low back pain, unspecified: Secondary | ICD-10-CM

## 2023-12-18 ENCOUNTER — Other Ambulatory Visit

## 2024-01-21 IMAGING — CT CT CHEST W/O CM
2 of 4 series · 15 of 36 positions shown, 18 images · non-contrast
Comparison: CT abdomen and pelvis dated October 04, 2020

CLINICAL DATA: Pulmonary nodules



[Series 2: thorax · axial · 0.73mm/px · z∈[-392,-76]mm · 12 of 186 slices shown, 15 images]
[im 14/186  mediastinal]
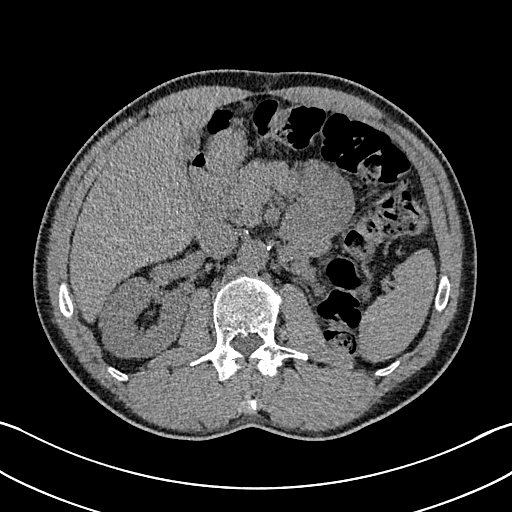
[im 14/186  lung]
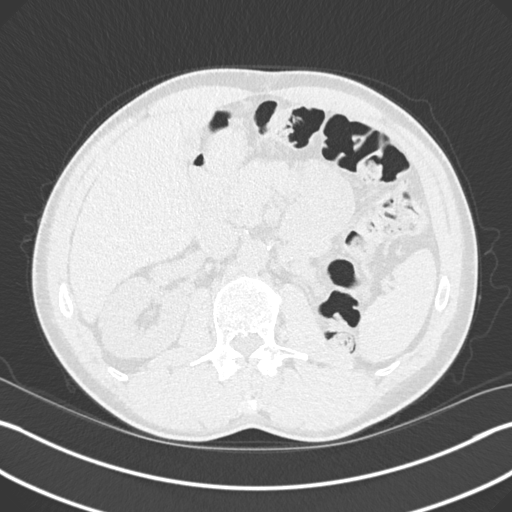
[im 27/186  lung]
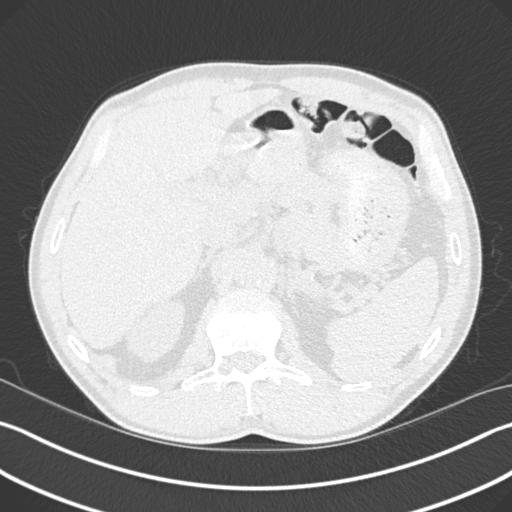
[im 40/186  lung]
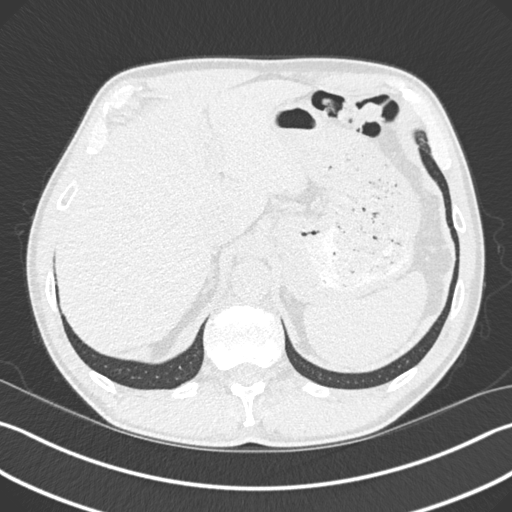
[im 53/186  lung]
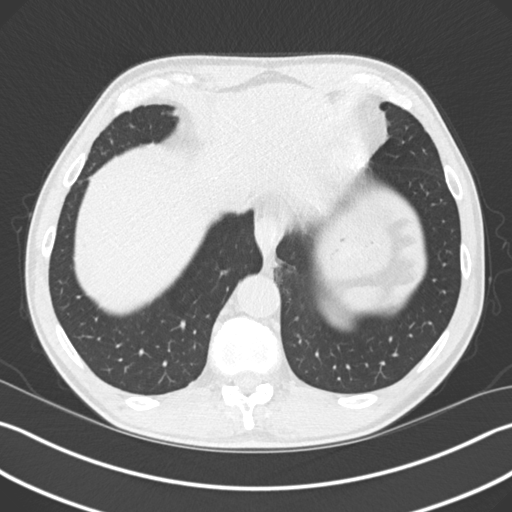
[im 67/186  mediastinal]
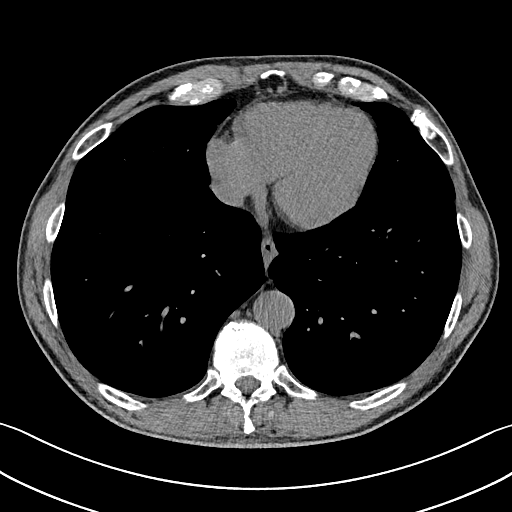
[im 67/186  lung]
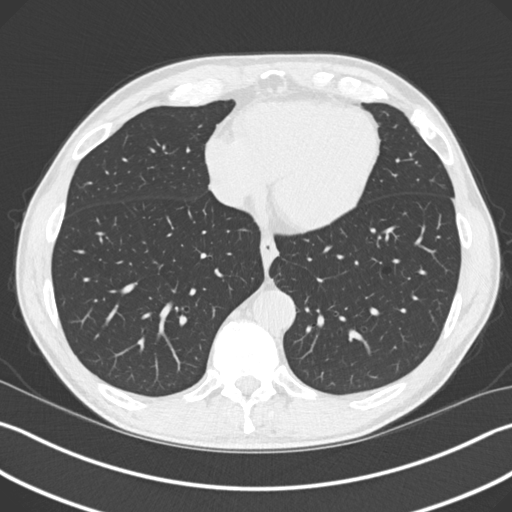
[im 80/186  lung]
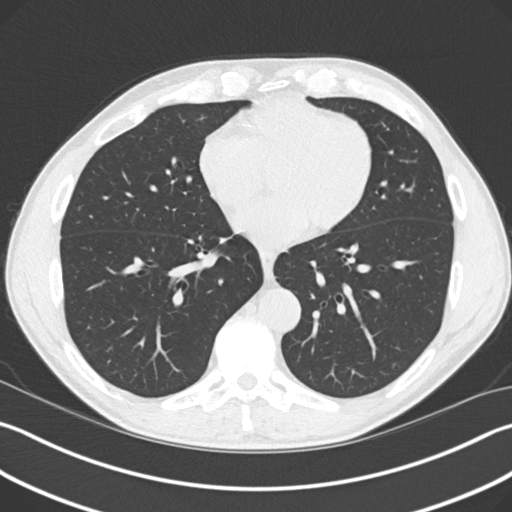
[im 106/186  lung]
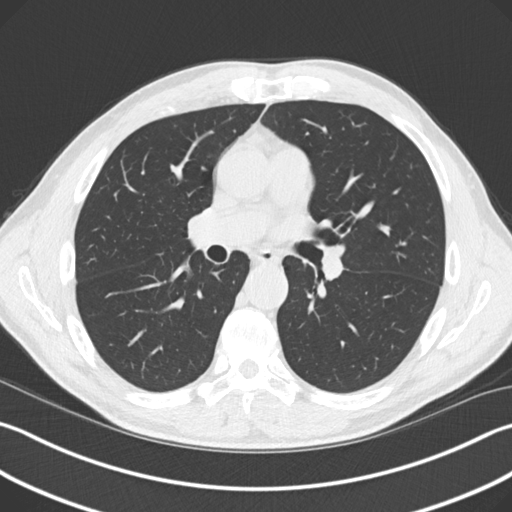
[im 119/186  lung]
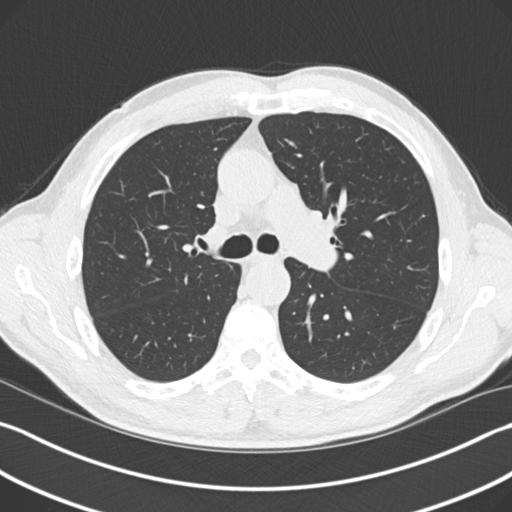
[im 133/186  mediastinal]
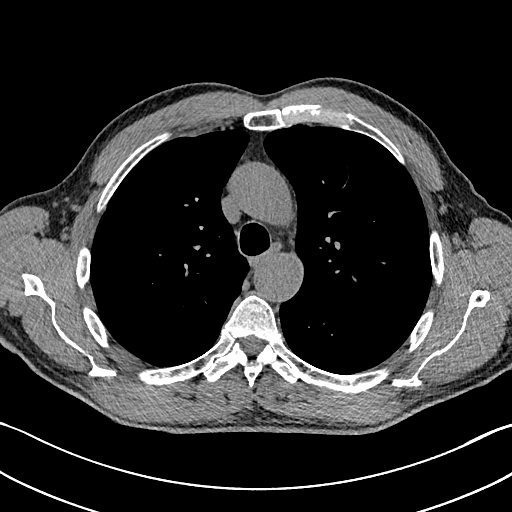
[im 133/186  lung]
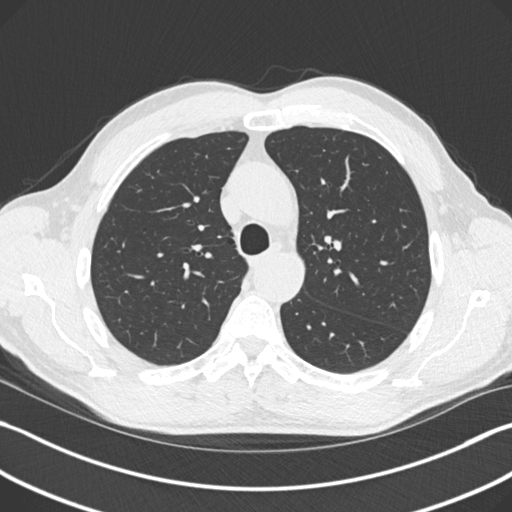
[im 146/186  lung]
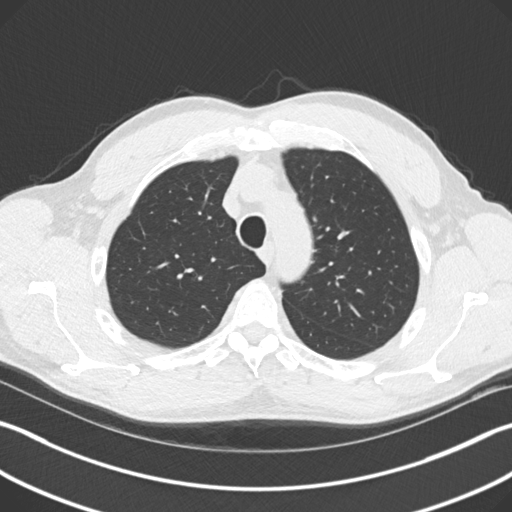
[im 159/186  lung]
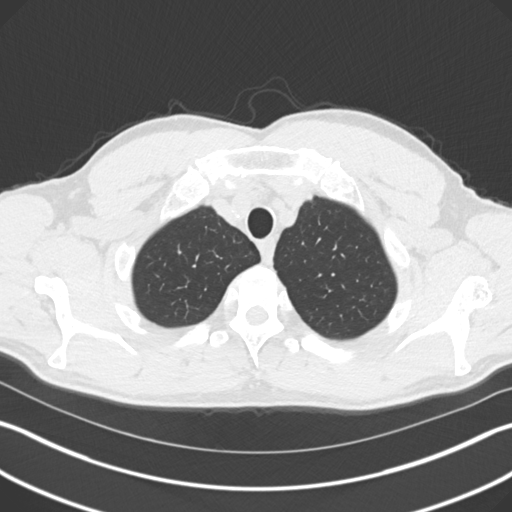
[im 172/186  lung]
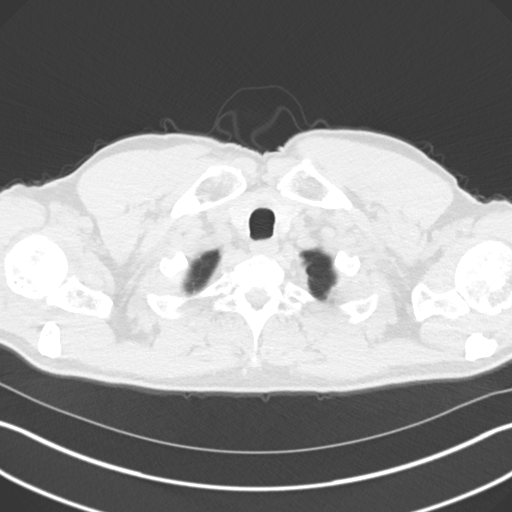

[Series 5: coronal · coronal · 0.72mm/px · 3 of 140 slices shown]
[im 28/140  lung]
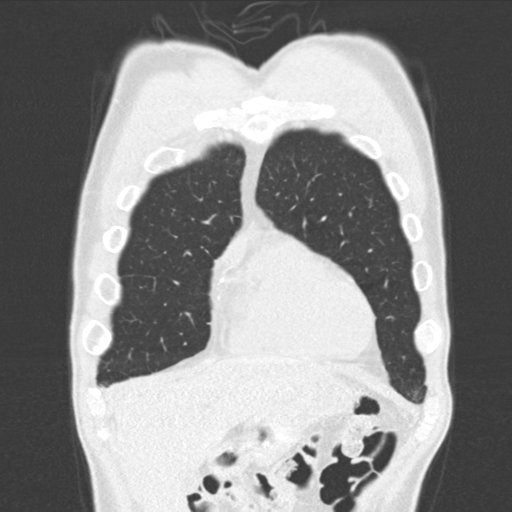
[im 56/140  lung]
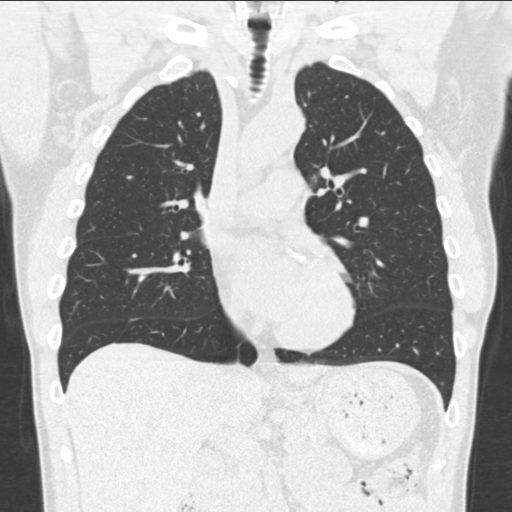
[im 84/140  lung]
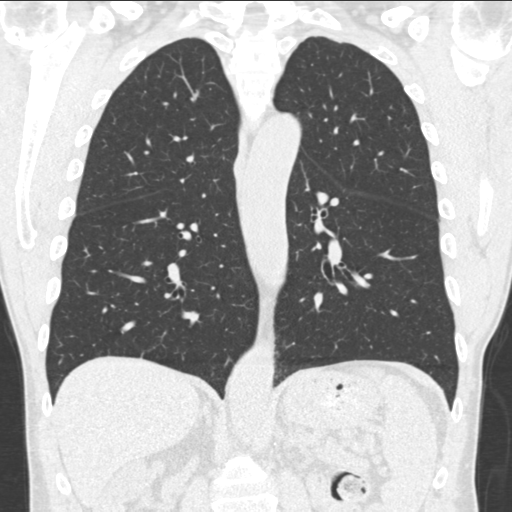

[15 of 36 positions shown; findings below may reference images not displayed]

FINDINGS: Cardiovascular: Normal heart size. No pericardial effusion. Left
main and three-vessel coronary artery calcifications. Mild calcified
plaque of the thoracic aorta.

Mediastinum/Nodes: Esophagus and thyroid are unremarkable.No
pathologically enlarged lymph nodes seen in the chest.

Lungs/Pleura: Central airways are patent. No consolidation, pleural
effusion or pneumothorax. Solid pulmonary nodule of the left lower
lobe measuring 4 mm in mean diameter on series 7, image 114,
unchanged in size when compared with prior exam. Stable 2 mm nodule
of the right lower lobe located on image 132. Additional previously
seen tiny nodules are no longer visualized.

Upper Abdomen: No acute abnormality.

Musculoskeletal: No chest wall mass or suspicious bone lesions
identified.
IMPRESSION: 1. Previously seen solid pulmonary nodules are stable in size or no
longer present, largest nodule on current exam measures 4 mm. No
follow-up needed if patient is low-risk. Non-contrast chest CT can
be considered [REDACTED] months from October 04, 2020 exam to
establish 1 year stability if patient is high-risk. This
recommendation follows the consensus statement: Guidelines for
Management of Incidental Pulmonary Nodules Detected on CT Images:
2. Left main and three-vessel coronary artery calcifications.
3. Aortic Atherosclerosis (K49NY-AEP.P).

## 2024-01-22 NOTE — Discharge Instructions (Signed)

## 2024-01-23 ENCOUNTER — Ambulatory Visit
Admission: RE | Admit: 2024-01-23 | Discharge: 2024-01-23 | Disposition: A | Source: Ambulatory Visit | Attending: Family Medicine | Admitting: Family Medicine

## 2024-01-23 DIAGNOSIS — M545 Low back pain, unspecified: Secondary | ICD-10-CM

## 2024-01-23 MED ORDER — IOPAMIDOL (ISOVUE-M 200) INJECTION 41%
1.0000 mL | Freq: Once | INTRAMUSCULAR | Status: AC
  Administered 2024-01-23: 1 mL via EPIDURAL

## 2024-01-23 MED ORDER — METHYLPREDNISOLONE ACETATE 40 MG/ML INJ SUSP (RADIOLOG
80.0000 mg | Freq: Once | INTRAMUSCULAR | Status: AC
  Administered 2024-01-23: 80 mg via EPIDURAL

## 2024-01-26 LAB — LIPID PANEL
Chol/HDL Ratio: 3.9 ratio (ref 0.0–5.0)
Cholesterol, Total: 292 mg/dL — ABNORMAL HIGH (ref 100–199)
HDL: 75 mg/dL
LDL Chol Calc (NIH): 204 mg/dL — ABNORMAL HIGH (ref 0–99)
Triglycerides: 83 mg/dL (ref 0–149)
VLDL Cholesterol Cal: 13 mg/dL (ref 5–40)

## 2024-01-26 LAB — ALT: ALT: 21 IU/L (ref 0–44)

## 2024-01-27 ENCOUNTER — Ambulatory Visit: Payer: Self-pay

## 2024-01-27 DIAGNOSIS — I700 Atherosclerosis of aorta: Secondary | ICD-10-CM

## 2024-01-27 DIAGNOSIS — I2510 Atherosclerotic heart disease of native coronary artery without angina pectoris: Secondary | ICD-10-CM

## 2024-03-17 ENCOUNTER — Ambulatory Visit

## 2024-04-20 ENCOUNTER — Ambulatory Visit: Admitting: Internal Medicine

## 2024-05-24 ENCOUNTER — Ambulatory Visit: Admitting: Internal Medicine
